# Patient Record
Sex: Female | Born: 1965 | Race: Black or African American | Hispanic: No | Marital: Married | Smoking: Former smoker
Health system: Southern US, Community
[De-identification: ages and names within clinical notes are randomized; demographics above are authoritative.]

## PROBLEM LIST (undated history)

## (undated) DIAGNOSIS — T7840XA Allergy, unspecified, initial encounter: Secondary | ICD-10-CM

## (undated) DIAGNOSIS — I10 Essential (primary) hypertension: Secondary | ICD-10-CM

## (undated) DIAGNOSIS — L409 Psoriasis, unspecified: Secondary | ICD-10-CM

## (undated) DIAGNOSIS — R7303 Prediabetes: Secondary | ICD-10-CM

## (undated) DIAGNOSIS — E042 Nontoxic multinodular goiter: Secondary | ICD-10-CM

## (undated) HISTORY — DX: Essential (primary) hypertension: I10

## (undated) HISTORY — DX: Nontoxic multinodular goiter: E04.2

## (undated) HISTORY — DX: Allergy, unspecified, initial encounter: T78.40XA

---
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
## 1999-10-09 ENCOUNTER — Encounter: Payer: Self-pay | Admitting: Emergency Medicine

## 1999-10-09 ENCOUNTER — Encounter: Admission: RE | Admit: 1999-10-09 | Discharge: 1999-10-09 | Payer: Self-pay | Admitting: Emergency Medicine

## 2000-11-08 ENCOUNTER — Inpatient Hospital Stay (HOSPITAL_COMMUNITY): Admission: AD | Admit: 2000-11-08 | Discharge: 2000-11-08 | Payer: Self-pay | Admitting: *Deleted

## 2000-11-30 ENCOUNTER — Inpatient Hospital Stay (HOSPITAL_COMMUNITY): Admission: AD | Admit: 2000-11-30 | Discharge: 2000-11-30 | Payer: Self-pay | Admitting: Obstetrics

## 2014-11-16 ENCOUNTER — Emergency Department (HOSPITAL_COMMUNITY)
Admission: EM | Admit: 2014-11-16 | Discharge: 2014-11-16 | Disposition: A | Discharge status: Home / Self Care | Payer: 59 | Attending: Emergency Medicine | Admitting: Emergency Medicine

## 2014-11-16 ENCOUNTER — Encounter (HOSPITAL_COMMUNITY): Payer: Self-pay | Admitting: Emergency Medicine

## 2014-11-16 DIAGNOSIS — Z720 Tobacco use: Secondary | ICD-10-CM | POA: Diagnosis not present

## 2014-11-16 DIAGNOSIS — R197 Diarrhea, unspecified: Secondary | ICD-10-CM | POA: Diagnosis not present

## 2014-11-16 DIAGNOSIS — R112 Nausea with vomiting, unspecified: Secondary | ICD-10-CM | POA: Diagnosis not present

## 2014-11-16 DIAGNOSIS — R1030 Lower abdominal pain, unspecified: Secondary | ICD-10-CM | POA: Diagnosis present

## 2014-11-16 DIAGNOSIS — Z3202 Encounter for pregnancy test, result negative: Secondary | ICD-10-CM | POA: Insufficient documentation

## 2014-11-16 LAB — COMPREHENSIVE METABOLIC PANEL
ALT: 16 U/L (ref 14–54)
AST: 19 U/L (ref 15–41)
Albumin: 4.1 g/dL (ref 3.5–5.0)
Alkaline Phosphatase: 73 U/L (ref 38–126)
Anion gap: 8 (ref 5–15)
BUN: 16 mg/dL (ref 6–20)
CO2: 27 mmol/L (ref 22–32)
Calcium: 9.6 mg/dL (ref 8.9–10.3)
Chloride: 103 mmol/L (ref 101–111)
Creatinine, Ser: 0.95 mg/dL (ref 0.44–1.00)
GFR calc Af Amer: >60 mL/min (ref >60)
GFR calc non Af Amer: >60 mL/min (ref >60)
Glucose, Bld: 167 mg/dL — ABNORMAL HIGH (ref 65–99)
Potassium: 3.8 mmol/L (ref 3.5–5.1)
Sodium: 138 mmol/L (ref 135–145)
Total Bilirubin: 0.5 mg/dL (ref 0.3–1.2)
Total Protein: 7.2 g/dL (ref 6.5–8.1)

## 2014-11-16 LAB — CBC
HCT: 39.7 % (ref 36.0–46.0)
Hemoglobin: 13.3 g/dL (ref 12.0–15.0)
MCH: 28.9 pg (ref 26.0–34.0)
MCHC: 33.5 g/dL (ref 30.0–36.0)
MCV: 86.1 fL (ref 78.0–100.0)
Platelets: 194 10*3/uL (ref 150–400)
RBC: 4.61 MIL/uL (ref 3.87–5.11)
RDW: 13.6 % (ref 11.5–15.5)
WBC: 11.3 10*3/uL — ABNORMAL HIGH (ref 4.0–10.5)

## 2014-11-16 LAB — URINALYSIS, ROUTINE W REFLEX MICROSCOPIC
BILIRUBIN URINE: NEGATIVE
Glucose, UA: 100 mg/dL — AB
Hgb urine dipstick: NEGATIVE
Ketones, ur: NEGATIVE mg/dL
LEUKOCYTES UA: NEGATIVE
NITRITE: NEGATIVE
PH: 7.5 (ref 5.0–8.0)
Protein, ur: NEGATIVE mg/dL
SPECIFIC GRAVITY, URINE: 1.014 (ref 1.005–1.030)
Urobilinogen, UA: 0.2 mg/dL (ref 0.0–1.0)

## 2014-11-16 LAB — PREGNANCY, URINE: Preg Test, Ur: NEGATIVE

## 2014-11-16 MED ORDER — ONDANSETRON 4 MG PO TBDP: 4.0000 mg | ORAL_TABLET | Freq: Three times a day (TID) | ORAL | Status: DC | PRN

## 2014-11-16 MED ORDER — ONDANSETRON HCL 4 MG/2ML IJ SOLN
4.0000 mg | Freq: Once | INTRAMUSCULAR | Status: AC
  Administered 2014-11-16: 4 mg via INTRAVENOUS
  Filled 2014-11-16: qty 2

## 2014-11-16 MED ORDER — OXYCODONE-ACETAMINOPHEN 5-325 MG PO TABS
1.0000 | ORAL_TABLET | Freq: Once | ORAL | Status: AC
  Administered 2014-11-16: 1 via ORAL
  Filled 2014-11-16: qty 1

## 2014-11-16 MED ORDER — SODIUM CHLORIDE 0.9 % IV BOLUS (SEPSIS)
1000.0000 mL | Freq: Once | INTRAVENOUS | Status: AC
Start: 2014-11-16 — End: 2014-11-16
  Administered 2014-11-16: 1000 mL via INTRAVENOUS

## 2014-11-16 MED ORDER — LOPERAMIDE HCL 2 MG PO CAPS: 2.0000 mg | ORAL_CAPSULE | Freq: Four times a day (QID) | ORAL | Status: DC | PRN

## 2014-11-16 NOTE — ED Provider Notes (Signed)
CSN: [id number]     Arrival date & time 11/16/14  0255 History   This chart was scribed for  Merryl Hacker, MD by Altamease Oiler, ED Scribe. This patient was seen in room A01C/A01C and the patient's care was started at 3:13 AM.  Chief Complaint  Patient presents with  . Abdominal Pain    The history is provided by the patient. No language interpreter was used.   Whitney Patton is a 49 y.o. female who presents to the Emergency Department complaining of constant lower abdominal pain with onset around 11 PM tonight. The pain is described as sharp, pressure, and cramping. At initial onset the pain was 10/10 in severity but she rates it less than 1/10 currently. No modifying factors. Pt has never had pain like this before tonight. Associated symptoms include nausea, vomiting, and diarrhea. No known sick contact. No fever, chills,vaginal bleeding, abnormal vaginal discharge, dysuria, hematemesis, or hematochezia. No previous abdominal surgeries. No daily medication.   History reviewed. No pertinent past medical history. History reviewed. No pertinent past surgical history. No family history on file. History  Substance Use Topics  . Smoking status: Current Every Day Smoker  . Smokeless tobacco: Not on file  . Alcohol Use: Yes   OB History    No data available     Review of Systems  Constitutional: Negative for fever.  Respiratory: Negative for cough, chest tightness and shortness of breath.   Cardiovascular: Negative for chest pain.  Gastrointestinal: Positive for nausea, vomiting, abdominal pain and diarrhea.  Genitourinary: Negative for dysuria.  Musculoskeletal: Negative for back pain.  Neurological: Negative for headaches.  All other systems reviewed and are negative.     Allergies  Review of patient's allergies indicates no known allergies.  Home Medications   Prior to Admission medications   Medication Sig Start Date End Date Taking? Authorizing Provider  loperamide  (IMODIUM) 2 MG capsule Take 1 capsule (2 mg total) by mouth 4 (four) times daily as needed for diarrhea or loose stools. 11/16/14   Merryl Hacker, MD  ondansetron (ZOFRAN ODT) 4 MG disintegrating tablet Take 1 tablet (4 mg total) by mouth every 8 (eight) hours as needed for nausea or vomiting. 11/16/14   Merryl Hacker, MD   BP 204/108 mmHg  Pulse 72  Temp(Src) 97.6 F (36.4 C) (Oral)  Resp 20  SpO2 98%  LMP 11/01/2014 (Exact Date) Physical Exam  Constitutional: She is oriented to person, place, and time. She appears well-developed and well-nourished. No distress.  HENT:  Head: Normocephalic and atraumatic.  Mouth/Throat: Oropharynx is clear and moist.  Cardiovascular: Normal rate, regular rhythm and normal heart sounds.   Pulmonary/Chest: Effort normal. No respiratory distress. She has no wheezes.  Abdominal: Soft. Bowel sounds are normal. There is no tenderness. There is no rebound and no guarding.  Neurological: She is alert and oriented to person, place, and time.  Skin: Skin is warm and dry.  Psychiatric: She has a normal mood and affect.  Nursing note and vitals reviewed.   ED Course  Procedures   DIAGNOSTIC STUDIES: Oxygen Saturation is 98% on RA, normal by my interpretation.    COORDINATION OF CARE: 3:18 AM Discussed treatment plan which includes lab work, Zofran, and IVF with pt at bedside and pt agreed to plan.  Labs Review Labs Reviewed  COMPREHENSIVE METABOLIC PANEL - Abnormal; Notable for the following:    Glucose, Bld 167 (*)    All other components within normal limits  CBC - Abnormal; Notable for the following:    WBC 11.3 (*)    All other components within normal limits  URINALYSIS, ROUTINE W REFLEX MICROSCOPIC (NOT AT [facility]) - Abnormal; Notable for the following:    APPearance CLOUDY (*)    Glucose, UA 100 (*)    All other components within normal limits  PREGNANCY, URINE    Imaging Review No results found.   EKG Interpretation None       MDM   Final diagnoses:  Nausea vomiting and diarrhea    Patient presents with abdominal pain, nausea, vomiting, diarrhea. Nontoxic on exam. Initial hypertensive but improving blood pressures during ER stay. Currently pain free. Basic labwork obtained and reassuring. Patient has had multiple episodes of diarrhea in the emergency room and waxing and waning crampy pain. Given reassuring workup and exam, suspect viral etiology. Discussed with patient symptom control including Zofran and Imodium. Do not feel the patient needs imaging at this time. She is able tolerate fluids prior to discharge. Discussed with patient strict return precautions.  After history, exam, and medical workup I feel the patient has been appropriately medically screened and is safe for discharge home. Pertinent diagnoses were discussed with the patient. Patient was given return precautions.   I personally performed the services described in this documentation, which was scribed in my presence. The recorded information has been reviewed and is accurate.      Merryl Hacker, MD 11/17/14 [phone]

## 2014-11-16 NOTE — ED Notes (Signed)
Pt. reports low abdominal pain with emesis and diarrhea onset this evening , denies fever or chills. Hypertensive at triage .

## 2014-11-16 NOTE — ED Notes (Signed)
Pt ambulated to the bathroom independently.

## 2014-11-16 NOTE — ED Notes (Signed)
Pt verbalized undertanding of d/c instructions and has no further questions.

## 2014-11-16 NOTE — ED Notes (Signed)
Pt given [place] sandwich and ginger ale to drink.

## 2014-11-16 NOTE — Discharge Instructions (Signed)

## 2015-12-24 ENCOUNTER — Encounter: Payer: Self-pay | Admitting: Internal Medicine

## 2015-12-24 ENCOUNTER — Ambulatory Visit (INDEPENDENT_AMBULATORY_CARE_PROVIDER_SITE_OTHER): Payer: 59 | Admitting: Internal Medicine

## 2015-12-24 VITALS — BP 148/92 | HR 67 | Ht 62.0 in | Wt 131.0 lb

## 2015-12-24 DIAGNOSIS — Z0000 Encounter for general adult medical examination without abnormal findings: Secondary | ICD-10-CM | POA: Insufficient documentation

## 2015-12-24 DIAGNOSIS — J309 Allergic rhinitis, unspecified: Secondary | ICD-10-CM | POA: Diagnosis not present

## 2015-12-24 DIAGNOSIS — Z23 Encounter for immunization: Secondary | ICD-10-CM | POA: Diagnosis not present

## 2015-12-24 DIAGNOSIS — B372 Candidiasis of skin and nail: Secondary | ICD-10-CM | POA: Diagnosis not present

## 2015-12-24 DIAGNOSIS — I10 Essential (primary) hypertension: Secondary | ICD-10-CM | POA: Diagnosis not present

## 2015-12-24 DIAGNOSIS — Z1211 Encounter for screening for malignant neoplasm of colon: Secondary | ICD-10-CM

## 2015-12-24 MED ORDER — FLUTICASONE PROPIONATE 50 MCG/ACT NA SUSP: 2.0000 | Freq: Every day | NASAL | 6 refills | Status: DC

## 2015-12-24 MED ORDER — KETOCONAZOLE 2 % EX CREA: 1.0000 "application " | TOPICAL_CREAM | Freq: Every day | CUTANEOUS | 1 refills | Status: DC

## 2015-12-24 MED ORDER — AMLODIPINE BESYLATE 5 MG PO TABS: 5.0000 mg | ORAL_TABLET | Freq: Every day | ORAL | 11 refills | Status: DC

## 2015-12-24 MED ORDER — VITAMIN D 1000 UNITS PO TABS: 1000.0000 [IU] | ORAL_TABLET | Freq: Every day | ORAL | 3 refills | Status: DC

## 2015-12-24 MED ORDER — LORATADINE 10 MG PO TABS: 10.0000 mg | ORAL_TABLET | Freq: Every day | ORAL | 3 refills | Status: DC

## 2015-12-24 NOTE — Assessment & Plan Note (Signed)
Loratidine. Flonase

## 2015-12-24 NOTE — Assessment & Plan Note (Signed)
Restart Norvasc

## 2015-12-24 NOTE — Assessment & Plan Note (Signed)
L hand between fingers Ketoconazole cream

## 2015-12-24 NOTE — Assessment & Plan Note (Addendum)
We discussed age appropriate health related issues, including available/recomended screening tests and vaccinations. We discussed a need for adhering to healthy diet and exercise. Labs/EKG were reviewed/ordered. All questions were answered. PAP/GYN examadvised Colon to sch Stop smoking Pt declined mammo - "too painful"...

## 2015-12-24 NOTE — Progress Notes (Signed)
Subjective:  Patient ID: Whitney Patton, female    DOB: [birth year]  Age: 50 y.o. MRN: [id number]  CC: Establish Care   HPI Whitney Patton presents for a new pt well visit.  C/o HTN, sinus issues Rash between fingers - not better w/Kenalog  Outpatient Medications Prior to Visit  Medication Sig Dispense Refill  . loperamide (IMODIUM) 2 MG capsule Take 1 capsule (2 mg total) by mouth 4 (four) times daily as needed for diarrhea or loose stools. (Patient not taking: Reported on 12/24/2015) 12 capsule 0  . ondansetron (ZOFRAN ODT) 4 MG disintegrating tablet Take 1 tablet (4 mg total) by mouth every 8 (eight) hours as needed for nausea or vomiting. (Patient not taking: Reported on 12/24/2015) 20 tablet 0   No facility-administered medications prior to visit.     ROS Review of Systems  Constitutional: Negative for activity change, appetite change, chills, fatigue and unexpected weight change.  HENT: Positive for rhinorrhea. Negative for congestion, mouth sores and sinus pressure.   Eyes: Negative for visual disturbance.  Respiratory: Negative for cough and chest tightness.   Gastrointestinal: Negative for abdominal pain and nausea.  Genitourinary: Negative for difficulty urinating, frequency and vaginal pain.  Musculoskeletal: Negative for back pain and gait problem.  Skin: Negative for pallor and rash.  Neurological: Negative for dizziness, tremors, weakness, numbness and headaches.  Psychiatric/Behavioral: Negative for confusion, sleep disturbance and suicidal ideas.    Objective:  BP (!) 148/92   Pulse 67   Ht 5\' 2"  (1.575 m)   Wt 131 lb (59.4 kg)   SpO2 97%   BMI 23.96 kg/m   BP Readings from Last 3 Encounters:  12/24/15 (!) 148/92  11/16/14 140/77    Wt Readings from Last 3 Encounters:  12/24/15 131 lb (59.4 kg)    Physical Exam  Constitutional: She appears well-developed. No distress.  HENT:  Head: Normocephalic.  Right Ear: External ear normal.  Left Ear: External  ear normal.  Nose: Nose normal.  Mouth/Throat: Oropharynx is clear and moist.  Eyes: Conjunctivae are normal. Pupils are equal, round, and reactive to light. Right eye exhibits no discharge. Left eye exhibits no discharge.  Neck: Normal range of motion. Neck supple. No JVD present. No tracheal deviation present. No thyromegaly present.  Cardiovascular: Normal rate, regular rhythm and normal heart sounds.   Pulmonary/Chest: No stridor. No respiratory distress. She has no wheezes.  Abdominal: Soft. Bowel sounds are normal. She exhibits no distension and no mass. There is no tenderness. There is no rebound and no guarding.  Musculoskeletal: She exhibits no edema or tenderness.  Lymphadenopathy:    She has no cervical adenopathy.  Neurological: She displays normal reflexes. No cranial nerve deficit. She exhibits normal muscle tone. Coordination normal.  Skin: No rash noted. No erythema.  Psychiatric: She has a normal mood and affect. Her behavior is normal. Judgment and thought content normal.    Lab Results  Component Value Date   WBC 11.3 (H) 11/16/2014   HGB 13.3 11/16/2014   HCT 39.7 11/16/2014   PLT 194 11/16/2014   GLUCOSE 167 (H) 11/16/2014   ALT 16 11/16/2014   AST 19 11/16/2014   NA 138 11/16/2014   K 3.8 11/16/2014   CL 103 11/16/2014   CREATININE 0.95 11/16/2014   BUN 16 11/16/2014   CO2 27 11/16/2014    No results found.  Assessment & Plan:   There are no diagnoses linked to this encounter. I am having Ms. Eighmey maintain  her ondansetron, loperamide, amLODipine, and triamcinolone cream.  Meds ordered this encounter  Medications  . amLODipine (NORVASC) 5 MG tablet    Sig: Take 1 tablet by mouth daily.  Marland Kitchen triamcinolone cream (KENALOG) 0.1 %    Sig: daily.     Follow-up: No Follow-up on file.  Walker Kehr, MD

## 2015-12-24 NOTE — Progress Notes (Signed)
Pre visit review using our clinic review tool, if applicable. No additional management support is needed unless otherwise documented below in the visit note. 

## 2015-12-26 ENCOUNTER — Other Ambulatory Visit (INDEPENDENT_AMBULATORY_CARE_PROVIDER_SITE_OTHER): Payer: 59

## 2015-12-26 DIAGNOSIS — Z0000 Encounter for general adult medical examination without abnormal findings: Secondary | ICD-10-CM | POA: Diagnosis not present

## 2015-12-26 LAB — CBC WITH DIFFERENTIAL/PLATELET
Basophils Absolute: 0 10*3/uL (ref 0.0–0.1)
Basophils Relative: 0.4 % (ref 0.0–3.0)
Eosinophils Absolute: 0.1 10*3/uL (ref 0.0–0.7)
Eosinophils Relative: 3 % (ref 0.0–5.0)
HCT: 38 % (ref 36.0–46.0)
HEMOGLOBIN: 13.1 g/dL (ref 12.0–15.0)
Lymphocytes Relative: 43 % (ref 12.0–46.0)
Lymphs Abs: 1.9 10*3/uL (ref 0.7–4.0)
MCHC: 34.5 g/dL (ref 30.0–36.0)
MCV: 83.1 fl (ref 78.0–100.0)
Monocytes Absolute: 0.6 10*3/uL (ref 0.1–1.0)
Monocytes Relative: 12.3 % — ABNORMAL HIGH (ref 3.0–12.0)
Neutro Abs: 1.9 10*3/uL (ref 1.4–7.7)
Neutrophils Relative %: 41.3 % — ABNORMAL LOW (ref 43.0–77.0)
Platelets: 171 10*3/uL (ref 150.0–400.0)
RBC: 4.57 Mil/uL (ref 3.87–5.11)
RDW: 12.9 % (ref 11.5–15.5)
WBC: 4.5 10*3/uL (ref 4.0–10.5)

## 2015-12-26 LAB — URINALYSIS
BILIRUBIN URINE: NEGATIVE
HGB URINE DIPSTICK: NEGATIVE
Ketones, ur: NEGATIVE
Leukocytes, UA: NEGATIVE
Nitrite: NEGATIVE
Specific Gravity, Urine: 1.015 (ref 1.000–1.030)
Total Protein, Urine: NEGATIVE
URINE GLUCOSE: NEGATIVE
UROBILINOGEN UA: 0.2 (ref 0.0–1.0)
pH: 7 (ref 5.0–8.0)

## 2015-12-26 LAB — BASIC METABOLIC PANEL
BUN: 14 mg/dL (ref 6–23)
CHLORIDE: 105 meq/L (ref 96–112)
CO2: 26 meq/L (ref 19–32)
CREATININE: 0.73 mg/dL (ref 0.40–1.20)
Calcium: 8.7 mg/dL (ref 8.4–10.5)
GFR: 107.84 mL/min (ref >60.00)
Glucose, Bld: 103 mg/dL — ABNORMAL HIGH (ref 70–99)
Potassium: 4.1 mEq/L (ref 3.5–5.1)
Sodium: 135 mEq/L (ref 135–145)

## 2015-12-26 LAB — TSH: TSH: 0.86 u[IU]/mL (ref 0.35–4.50)

## 2015-12-26 LAB — LIPID PANEL
CHOL/HDL RATIO: 2
Cholesterol: 163 mg/dL (ref 0–200)
HDL: 73.4 mg/dL (ref >39.00)
LDL Cholesterol: 81 mg/dL (ref 0–99)
NONHDL: 89.31
TRIGLYCERIDES: 40 mg/dL (ref 0.0–149.0)
VLDL: 8 mg/dL (ref 0.0–40.0)

## 2015-12-26 LAB — HEPATIC FUNCTION PANEL
ALK PHOS: 57 U/L (ref 39–117)
ALT: 11 U/L (ref 0–35)
AST: 15 U/L (ref 0–37)
Albumin: 4 g/dL (ref 3.5–5.2)
Bilirubin, Direct: 0.1 mg/dL (ref 0.0–0.3)
TOTAL PROTEIN: 6.8 g/dL (ref 6.0–8.3)
Total Bilirubin: 0.4 mg/dL (ref 0.2–1.2)

## 2015-12-29 ENCOUNTER — Other Ambulatory Visit: Payer: Self-pay | Admitting: Internal Medicine

## 2015-12-29 ENCOUNTER — Telehealth: Payer: Self-pay | Admitting: Obstetrics and Gynecology

## 2015-12-29 LAB — VITAMIN D 25 HYDROXY (VIT D DEFICIENCY, FRACTURES): VITD: 17.93 ng/mL — ABNORMAL LOW (ref 30.00–100.00)

## 2015-12-29 MED ORDER — ERGOCALCIFEROL 1.25 MG (50000 UT) PO CAPS: 50000.0000 [IU] | ORAL_CAPSULE | ORAL | 0 refills | Status: DC

## 2015-12-29 NOTE — Telephone Encounter (Signed)
Called and left a message for patient to call back to schedule a new patient doctor referral. °

## 2015-12-30 ENCOUNTER — Encounter: Payer: Self-pay | Admitting: Internal Medicine

## 2015-12-31 ENCOUNTER — Ambulatory Visit: Payer: Self-pay | Admitting: Gynecology

## 2015-12-31 ENCOUNTER — Telehealth: Payer: Self-pay | Admitting: Internal Medicine

## 2015-12-31 NOTE — Telephone Encounter (Signed)
Is it Ketoconazole or Triamcinolone? Thx

## 2015-12-31 NOTE — Telephone Encounter (Signed)
Patient states Dr. Camila Li prescribed her a cream for a rash she had - possible eczema.  Patient states cream is causing her to itch and making rash scab over.  Please follow up with patient in regard.

## 2016-01-01 NOTE — Telephone Encounter (Signed)
Use triamcinolone instead Thx

## 2016-01-01 NOTE — Telephone Encounter (Signed)
Its the Ketoconazole cream.../lmb

## 2016-01-02 MED ORDER — TRIAMCINOLONE ACETONIDE 0.1 % EX CREA: TOPICAL_CREAM | CUTANEOUS | 0 refills | Status: DC

## 2016-01-02 NOTE — Telephone Encounter (Signed)
Called pt no answer LMOM w/MD recommendation...Johny Chess

## 2016-01-14 ENCOUNTER — Encounter: Payer: Self-pay | Admitting: Obstetrics and Gynecology

## 2016-01-14 ENCOUNTER — Ambulatory Visit (INDEPENDENT_AMBULATORY_CARE_PROVIDER_SITE_OTHER): Payer: 59 | Admitting: Obstetrics and Gynecology

## 2016-01-14 ENCOUNTER — Other Ambulatory Visit: Payer: Self-pay | Admitting: Obstetrics and Gynecology

## 2016-01-14 VITALS — BP 150/90 | HR 68 | Resp 14 | Ht 63.0 in | Wt 130.0 lb

## 2016-01-14 DIAGNOSIS — Z01419 Encounter for gynecological examination (general) (routine) without abnormal findings: Secondary | ICD-10-CM | POA: Diagnosis not present

## 2016-01-14 DIAGNOSIS — Z124 Encounter for screening for malignant neoplasm of cervix: Secondary | ICD-10-CM | POA: Diagnosis not present

## 2016-01-14 DIAGNOSIS — Z1231 Encounter for screening mammogram for malignant neoplasm of breast: Secondary | ICD-10-CM

## 2016-01-14 NOTE — Patient Instructions (Addendum)
Use Benadryl for your rash  EXERCISE AND DIET:  We recommended that you start or continue a regular exercise program for good health. Regular exercise means any activity that makes your heart beat faster and makes you sweat.  We recommend exercising at least 30 minutes per day at least 3 days a week, preferably 4 or 5.  We also recommend a diet low in fat and sugar.  Inactivity, poor dietary choices and obesity can cause diabetes, heart attack, stroke, and kidney damage, among others.    ALCOHOL AND SMOKING:  Women should limit their alcohol intake to no more than 7 drinks/beers/glasses of wine (combined, not each!) per week. Moderation of alcohol intake to this level decreases your risk of breast cancer and liver damage. And of course, no recreational drugs are part of a healthy lifestyle.  And absolutely no smoking or even second hand smoke. Most people know smoking can cause heart and lung diseases, but did you know it also contributes to weakening of your bones? Aging of your skin?  Yellowing of your teeth and nails?  CALCIUM AND VITAMIN D:  Adequate intake of calcium and Vitamin D are recommended.  The recommendations for exact amounts of these supplements seem to change often, but generally speaking 600 mg of calcium (either carbonate or citrate) and 800 units of Vitamin D per day seems prudent. Certain women may benefit from higher intake of Vitamin D.  If you are among these women, your doctor will have told you during your visit.    PAP SMEARS:  Pap smears, to check for cervical cancer or precancers,  have traditionally been done yearly, although recent scientific advances have shown that most women can have pap smears less often.  However, every woman still should have a physical exam from her gynecologist every year. It will include a breast check, inspection of the vulva and vagina to check for abnormal growths or skin changes, a visual exam of the cervix, and then an exam to evaluate the size  and shape of the uterus and ovaries.  And after 51 years of age, a rectal exam is indicated to check for rectal cancers. We will also provide age appropriate advice regarding health maintenance, like when you should have certain vaccines, screening for sexually transmitted diseases, bone density testing, colonoscopy, mammograms, etc.   MAMMOGRAMS:  All women over 4 years old should have a yearly mammogram. Many facilities now offer a "3D" mammogram, which may cost around $50 extra out of pocket. If possible,  we recommend you accept the option to have the 3D mammogram performed.  It both reduces the number of women who will be called back for extra views which then turn out to be normal, and it is better than the routine mammogram at detecting truly abnormal areas.    COLONOSCOPY:  Colonoscopy to screen for colon cancer is recommended for all women at age 54.  We know, you hate the idea of the prep.  We agree, BUT, having colon cancer and not knowing it is worse!!  Colon cancer so often starts as a polyp that can be seen and removed at colonscopy, which can quite literally save your life!  And if your first colonoscopy is normal and you have no family history of colon cancer, most women don't have to have it again for 10 years.  Once every ten years, you can do something that may end up saving your life, right?  We will be happy to help you get  it scheduled when you are ready.  Be sure to check your insurance coverage so you understand how much it will cost.  It may be covered as a preventative service at no cost, but you should check your particular policy.      Breast Self-Awareness Practicing breast self-awareness may pick up problems early, prevent significant medical complications, and possibly save your life. By practicing breast self-awareness, you can become familiar with how your breasts look and feel and if your breasts are changing. This allows you to notice changes early. It can also offer you  some reassurance that your breast health is good. One way to learn what is normal for your breasts and whether your breasts are changing is to do a breast self-exam. If you find a lump or something that was not present in the past, it is best to contact your caregiver right away. Other findings that should be evaluated by your caregiver include nipple discharge, especially if it is bloody; skin changes or reddening; areas where the skin seems to be pulled in (retracted); or new lumps and bumps. Breast pain is seldom associated with cancer (malignancy), but should also be evaluated by a caregiver. HOW TO PERFORM A BREAST SELF-EXAM The best time to examine your breasts is 5-7 days after your menstrual period is over. During menstruation, the breasts are lumpier, and it may be more difficult to pick up changes. If you do not menstruate, have reached menopause, or had your uterus removed (hysterectomy), you should examine your breasts at regular intervals, such as monthly. If you are breastfeeding, examine your breasts after a feeding or after using a breast pump. Breast implants do not decrease the risk for lumps or tumors, so continue to perform breast self-exams as recommended. Talk to your caregiver about how to determine the difference between the implant and breast tissue. Also, talk about the amount of pressure you should use during the exam. Over time, you will become more familiar with the variations of your breasts and more comfortable with the exam. A breast self-exam requires you to remove all your clothes above the waist. 1. Look at your breasts and nipples. Stand in front of a mirror in a room with good lighting. With your hands on your hips, push your hands firmly downward. Look for a difference in shape, contour, and size from one breast to the other (asymmetry). Asymmetry includes puckers, dips, or bumps. Also, look for skin changes, such as reddened or scaly areas on the breasts. Look for nipple  changes, such as discharge, dimpling, repositioning, or redness. 2. Carefully feel your breasts. This is best done either in the shower or tub while using soapy water or when flat on your back. Place the arm (on the side of the breast you are examining) above your head. Use the pads (not the fingertips) of your three middle fingers on your opposite hand to feel your breasts. Start in the underarm area and use  inch (2 cm) overlapping circles to feel your breast. Use 3 different levels of pressure (light, medium, and firm pressure) at each circle before moving to the next circle. The light pressure is needed to feel the tissue closest to the skin. The medium pressure will help to feel breast tissue a little deeper, while the firm pressure is needed to feel the tissue close to the ribs. Continue the overlapping circles, moving downward over the breast until you feel your ribs below your breast. Then, move one finger-width towards the center  of the body. Continue to use the  inch (2 cm) overlapping circles to feel your breast as you move slowly up toward the collar bone (clavicle) near the base of the neck. Continue the up and down exam using all 3 pressures until you reach the middle of the chest. Do this with each breast, carefully feeling for lumps or changes. 3.  Keep a written record with breast changes or normal findings for each breast. By writing this information down, you do not need to depend only on memory for size, tenderness, or location. Write down where you are in your menstrual cycle, if you are still menstruating. Breast tissue can have some lumps or thick tissue. However, see your caregiver if you find anything that concerns you.  SEEK MEDICAL CARE IF:  You see a change in shape, contour, or size of your breasts or nipples.   You see skin changes, such as reddened or scaly areas on the breasts or nipples.   You have an unusual discharge from your nipples.   You feel a new lump or  unusually thick areas.    This information is not intended to replace advice given to you by your health care provider. Make sure you discuss any questions you have with your health care provider.   Document Released: 04/05/2005 Document Revised: 03/22/2012 Document Reviewed: 07/21/2011 Elsevier Interactive Patient Education [employer].

## 2016-01-14 NOTE — Progress Notes (Signed)
50 y.o. G2P1011 MarriedAfrican AmericanF here for annual exam.   Period Cycle (Days): 28 Period Duration (Days): 5- 7 days  Period Pattern: Regular Menstrual Flow: Moderate Menstrual Control: Maxi pad Dysmenorrhea: None  Saturates a pad in 4 hours. No BTB. Sexually active, no contraception long term, no pain.  Slight vasomotor symptoms. Mild vaginal dryness.   Patient's last menstrual period was 01/05/2016.          Sexually active: Yes.    The current method of family planning is none.    Exercising: No.  The patient does not participate in regular exercise at present. Smoker:  yes  Health Maintenance: Pap:  unsure History of abnormal Pap:  Yes- years ago MMG:  unsure Colonoscopy:  Never, scheduled  BMD:   Never TDaP:  12-24-15 Gardasil: N/A   reports that she has been smoking Cigarettes.  She has been smoking about 0.25 packs per day. She has never used smokeless tobacco. She reports that she drinks about 4.2 oz of alcohol per week . She reports that she does not use drugs.Daughter is 80, 5 grandsons. Daughter has troubles doesn't have her kids. Married x 7 years, together for 10 years prior.   Past Medical History:  Diagnosis Date  . Allergy   . Hypertension     No past surgical history on file.  Current Outpatient Prescriptions  Medication Sig Dispense Refill  . amLODipine (NORVASC) 5 MG tablet Take 1 tablet (5 mg total) by mouth daily. 30 tablet 11  . cholecalciferol (VITAMIN D) 1000 units tablet Take 1 tablet (1,000 Units total) by mouth daily. 100 tablet 3  . ergocalciferol (VITAMIN D2) 50000 units capsule Take 1 capsule (50,000 Units total) by mouth once a week. 6 capsule 0  . triamcinolone cream (KENALOG) 0.1 % Apply to affected area twice a day as needed 30 g 0   No current facility-administered medications for this visit.     Family History  Problem Relation Age of Onset  . Lung cancer Mother   . Diabetes Father   . Diabetes Brother     Review of Systems   Constitutional: Negative.   HENT: Negative.   Eyes: Negative.   Respiratory: Negative.   Cardiovascular: Negative.   Gastrointestinal: Negative.   Endocrine: Negative.   Genitourinary: Negative.   Musculoskeletal: Negative.   Skin: Negative.   Allergic/Immunologic: Negative.   Neurological: Negative.   Psychiatric/Behavioral: Negative.     Exam:   BP (!) 150/90 (BP Location: Right Arm, Patient Position: Sitting, Cuff Size: Normal)   Pulse 68   Resp 14   Ht 5\' 3"  (1.6 m)   Wt 130 lb (59 kg)   LMP 01/05/2016   BMI 23.03 kg/m   Weight change: @WEIGHTCHANGE @ Height:   Height: 5\' 3"  (160 cm)  Ht Readings from Last 3 Encounters:  01/14/16 5\' 3"  (1.6 m)  12/24/15 5\' 2"  (1.575 m)    General appearance: alert, cooperative and appears stated age Head: Normocephalic, without obvious abnormality, atraumatic Neck: no adenopathy, supple, symmetrical, trachea midline and thyroid normal to inspection and palpation Lungs: clear to auscultation bilaterally Breasts: normal appearance, no masses or tenderness Heart: regular rate and rhythm Abdomen: soft, non-tender; bowel sounds normal; no masses,  no organomegaly Extremities: extremities normal, atraumatic, no cyanosis or edema Skin: Skin color, texture, turgor normal. No rashes or lesions Lymph nodes: Cervical, supraclavicular, and axillary nodes normal. No abnormal inguinal nodes palpated Neurologic: Grossly normal   Pelvic: External genitalia:  no lesions  Urethra:  normal appearing urethra with no masses, tenderness or lesions              Bartholins and Skenes: normal                 Vagina: normal appearing vagina with normal color and discharge, no lesions              Cervix: no lesions               Bimanual Exam:  Uterus:  normal size, contour, position, consistency, mobility, non-tender and retroverted              Adnexa: no mass, fullness, tenderness               Rectovaginal: Confirms               Anus:   normal sphincter tone, no lesions  Chaperone was present for exam.  A:  Well Woman with normal exam  P:   Pap with hpv  Mammogram  Colonoscopy UTD  Labs and immunizations with primary  Discussed breast self exam  Discussed calcium and vit D intake

## 2016-01-16 ENCOUNTER — Ambulatory Visit (INDEPENDENT_AMBULATORY_CARE_PROVIDER_SITE_OTHER): Payer: 59 | Admitting: Adult Health

## 2016-01-16 ENCOUNTER — Encounter: Payer: Self-pay | Admitting: Adult Health

## 2016-01-16 VITALS — BP 130/64 | Ht 63.0 in | Wt 129.1 lb

## 2016-01-16 DIAGNOSIS — L500 Allergic urticaria: Secondary | ICD-10-CM | POA: Diagnosis not present

## 2016-01-16 LAB — IPS PAP TEST WITH HPV

## 2016-01-16 MED ORDER — METHYLPREDNISOLONE ACETATE 80 MG/ML IJ SUSP
80.0000 mg | Freq: Once | INTRAMUSCULAR | Status: AC
  Administered 2016-01-16: 80 mg via INTRAMUSCULAR

## 2016-01-16 NOTE — Patient Instructions (Addendum)
It was great meeting you today.   You can continue with Benadryl tonight and if needed tomorrow evening.   You should notice a difference in the next 24 -36 hours.   Follow up with PCP if no improvement   Hives Hives are itchy, red, swollen areas of the skin. They can vary in size and location on your body. Hives can come and go for hours or several days (acute hives) or for several weeks (chronic hives). Hives do not spread from person to person (noncontagious). They may get worse with scratching, exercise, and emotional stress. CAUSES   Allergic reaction to food, additives, or drugs.  Infections, including the common cold.  Illness, such as vasculitis, lupus, or thyroid disease.  Exposure to sunlight, heat, or cold.  Exercise.  Stress.  Contact with chemicals. SYMPTOMS   Red or white swollen patches on the skin. The patches may change size, shape, and location quickly and repeatedly.  Itching.  Swelling of the hands, feet, and face. This may occur if hives develop deeper in the skin. DIAGNOSIS  Your caregiver can usually tell what is wrong by performing a physical exam. Skin or blood tests may also be done to determine the cause of your hives. In some cases, the cause cannot be determined. TREATMENT  Mild cases usually get better with medicines such as antihistamines. Severe cases may require an emergency epinephrine injection. If the cause of your hives is known, treatment includes avoiding that trigger.  HOME CARE INSTRUCTIONS   Avoid causes that trigger your hives.  Take antihistamines as directed by your caregiver to reduce the severity of your hives. Non-sedating or low-sedating antihistamines are usually recommended. Do not drive while taking an antihistamine.  Take any other medicines prescribed for itching as directed by your caregiver.  Wear loose-fitting clothing.  Keep all follow-up appointments as directed by your caregiver. SEEK MEDICAL CARE IF:   You  have persistent or severe itching that is not relieved with medicine.  You have painful or swollen joints. SEEK IMMEDIATE MEDICAL CARE IF:   You have a fever.  Your tongue or lips are swollen.  You have trouble breathing or swallowing.  You feel tightness in the throat or chest.  You have abdominal pain. These problems may be the first sign of a life-threatening allergic reaction. Call your local emergency services (911 in [place] MAKE SURE YOU:   Understand these instructions.  Will watch your condition.  Will get help right away if you are not doing well or get worse.   This information is not intended to replace advice given to you by your health care provider. Make sure you discuss any questions you have with your health care provider.   Document Released: 04/05/2005 Document Revised: 04/10/2013 Document Reviewed: 06/29/2011 Elsevier Interactive Patient Education [employer].

## 2016-01-16 NOTE — Progress Notes (Signed)
   Subjective:    Patient ID: Whitney Patton, female    DOB: [birth year], 50 y.o.   MRN: [id number]  HPI  50 year old female who presents to the office today for the complaint of a rash on her left forearm that started about a week ago. She reports that the rash is itchy and appears as " spots" of different sizes. She feels as though the "spots" are increasing in number. She first reports that the rash started to show up a few hours after eating seafood.   She has not changed shampoos, detergents or lotions.   She has been using Benadryl and anti itch cream that help minimally.   Denies any SOB or chest pain  Review of Systems  Constitutional: Negative.   Respiratory: Negative.   Cardiovascular: Negative.   Skin: Positive for rash.  Neurological: Negative.   All other systems reviewed and are negative.      Objective:   Physical Exam  Constitutional: She is oriented to person, place, and time. She appears well-developed and well-nourished. No distress.  Cardiovascular: Normal rate, regular rhythm, normal heart sounds and intact distal pulses.  Exam reveals no gallop and no friction rub.   No murmur heard. Neurological: She is alert and oriented to person, place, and time.  Skin: Skin is warm and dry. Rash (appears as Urticaria on left forearm, most noticably in the bend of the arm) noted. She is not diaphoretic. No erythema. No pallor.  Psychiatric: She has a normal mood and affect. Her behavior is normal. Judgment and thought content normal.  Nursing note and vitals reviewed.     Assessment & Plan:  1. Allergic urticaria - Unknown what has caused her allergic reaction. Possibly seafood.  - methylPREDNISolone acetate (DEPO-MEDROL) injection 80 mg; Inject 1 mL (80 mg total) into the muscle once. - Follow up if no improvement  Dorothyann Peng, NP

## 2016-01-20 ENCOUNTER — Ambulatory Visit: Payer: 59 | Admitting: Adult Health

## 2016-01-20 ENCOUNTER — Ambulatory Visit: Payer: 59 | Admitting: Internal Medicine

## 2016-01-21 ENCOUNTER — Encounter: Payer: Self-pay | Admitting: Internal Medicine

## 2016-01-21 ENCOUNTER — Ambulatory Visit (INDEPENDENT_AMBULATORY_CARE_PROVIDER_SITE_OTHER): Payer: 59 | Admitting: Internal Medicine

## 2016-01-21 DIAGNOSIS — J069 Acute upper respiratory infection, unspecified: Secondary | ICD-10-CM | POA: Diagnosis not present

## 2016-01-21 DIAGNOSIS — L500 Allergic urticaria: Secondary | ICD-10-CM | POA: Diagnosis not present

## 2016-01-21 DIAGNOSIS — I10 Essential (primary) hypertension: Secondary | ICD-10-CM | POA: Diagnosis not present

## 2016-01-21 MED ORDER — IRBESARTAN 300 MG PO TABS: 300.0000 mg | ORAL_TABLET | Freq: Every day | ORAL | 11 refills | Status: DC

## 2016-01-21 NOTE — Progress Notes (Signed)
Subjective:  Patient ID: Whitney Patton, female    DOB: 13-Oct-1965  Age: 50 y.o. MRN: [id number]  CC: Rash (bilateral arms x 3 weeks. )   HPI Whitney Patton presents for hives on B arms x 4-6 weeks - recurrent  Outpatient Medications Prior to Visit  Medication Sig Dispense Refill  . amLODipine (NORVASC) 5 MG tablet Take 1 tablet (5 mg total) by mouth daily. 30 tablet 11  . cholecalciferol (VITAMIN D) 1000 units tablet Take 1 tablet (1,000 Units total) by mouth daily. 100 tablet 3  . ergocalciferol (VITAMIN D2) 50000 units capsule Take 1 capsule (50,000 Units total) by mouth once a week. 6 capsule 0  . triamcinolone cream (KENALOG) 0.1 % Apply to affected area twice a day as needed 30 g 0   No facility-administered medications prior to visit.     ROS Review of Systems  Constitutional: Negative for activity change, appetite change, chills, fatigue and unexpected weight change.  HENT: Negative for congestion, mouth sores and sinus pressure.   Eyes: Negative for visual disturbance.  Respiratory: Negative for cough and chest tightness.   Gastrointestinal: Negative for abdominal pain and nausea.  Genitourinary: Negative for difficulty urinating, frequency and vaginal pain.  Musculoskeletal: Negative for back pain and gait problem.  Skin: Positive for rash. Negative for pallor.  Neurological: Negative for dizziness, tremors, weakness, numbness and headaches.  Psychiatric/Behavioral: Negative for confusion and sleep disturbance.    Objective:  BP (!) 154/100   Pulse 86   Temp 98.2 F (36.8 C) (Oral)   Wt 133 lb (60.3 kg)   LMP 01/05/2016   SpO2 97%   BMI 23.56 kg/m   BP Readings from Last 3 Encounters:  01/21/16 (!) 154/100  01/16/16 130/64  01/14/16 (!) 150/90    Wt Readings from Last 3 Encounters:  01/21/16 133 lb (60.3 kg)  01/16/16 129 lb 1.6 oz (58.6 kg)  01/14/16 130 lb (59 kg)    Physical Exam  Constitutional: She appears well-developed. No distress.  HENT:    Head: Normocephalic.  Right Ear: External ear normal.  Left Ear: External ear normal.  Nose: Nose normal.  Mouth/Throat: Oropharynx is clear and moist.  Eyes: Conjunctivae are normal. Pupils are equal, round, and reactive to light. Right eye exhibits no discharge. Left eye exhibits no discharge.  Neck: Normal range of motion. Neck supple. No JVD present. No tracheal deviation present. No thyromegaly present.  Cardiovascular: Normal rate, regular rhythm and normal heart sounds.   Pulmonary/Chest: No stridor. No respiratory distress. She has no wheezes.  Abdominal: Soft. Bowel sounds are normal. She exhibits no distension and no mass. There is no tenderness. There is no rebound and no guarding.  Musculoskeletal: She exhibits no edema or tenderness.  Lymphadenopathy:    She has no cervical adenopathy.  Neurological: She displays normal reflexes. No cranial nerve deficit. She exhibits normal muscle tone. Coordination normal.  Skin: Rash noted. There is erythema.  Psychiatric: She has a normal mood and affect. Her behavior is normal. Judgment and thought content normal.   Eryth hives on B forears x6-9 each  Lab Results  Component Value Date   WBC 4.5 12/26/2015   HGB 13.1 12/26/2015   HCT 38.0 12/26/2015   PLT 171.0 12/26/2015   GLUCOSE 103 (H) 12/26/2015   CHOL 163 12/26/2015   TRIG 40.0 12/26/2015   HDL 73.40 12/26/2015   LDLCALC 81 12/26/2015   ALT 11 12/26/2015   AST 15 12/26/2015   NA 135  12/26/2015   K 4.1 12/26/2015   CL 105 12/26/2015   CREATININE 0.73 12/26/2015   BUN 14 12/26/2015   CO2 26 12/26/2015   TSH 0.86 12/26/2015    No results found.  Assessment & Plan:   There are no diagnoses linked to this encounter. I am having Ms. Jaimes maintain her amLODipine, cholecalciferol, ergocalciferol, and triamcinolone cream.  No orders of the defined types were placed in this encounter.    Follow-up: No Follow-up on file.  Walker Kehr, MD

## 2016-01-21 NOTE — Progress Notes (Signed)
Pre visit review using our clinic review tool, if applicable. No additional management support is needed unless otherwise documented below in the visit note. 

## 2016-01-21 NOTE — Assessment & Plan Note (Signed)
10/17 hives ?related to Norvasc - will d/c Start Avapro 300 mg/d RTC 6 wks

## 2016-01-21 NOTE — Patient Instructions (Signed)
Use over-the-counter  "cold" medicines  such as "Afrin" nasal spray for nasal congestion as directed instead. Use" Delsym" or" Robitussin" cough syrup varietis for cough.  You can use plain "Tylenol" or "Advil" for fever, chills and achyness. Use Halls or Ricola cough drops.   "Common cold" symptoms are usually triggered by a virus.  The antibiotics are usually not necessary. On average, a" viral cold" illness would take 4-7 days to resolve. Please, make an appointment if you are not better or if you're worse.  

## 2016-01-21 NOTE — Assessment & Plan Note (Signed)
10/17 ?related to Norvasc - will d/c Depo-medrol IM

## 2016-01-22 ENCOUNTER — Ambulatory Visit (AMBULATORY_SURGERY_CENTER): Payer: Self-pay

## 2016-01-22 ENCOUNTER — Encounter: Payer: Self-pay | Admitting: Internal Medicine

## 2016-01-22 VITALS — Ht 63.0 in | Wt 135.0 lb

## 2016-01-22 DIAGNOSIS — J069 Acute upper respiratory infection, unspecified: Secondary | ICD-10-CM | POA: Insufficient documentation

## 2016-01-22 DIAGNOSIS — Z1211 Encounter for screening for malignant neoplasm of colon: Secondary | ICD-10-CM

## 2016-01-22 MED ORDER — SUPREP BOWEL PREP KIT 17.5-3.13-1.6 GM/177ML PO SOLN: 1.0000 | Freq: Once | ORAL | 0 refills | Status: AC

## 2016-01-22 NOTE — Progress Notes (Signed)
No allergies to eggs or soy No past exposure to anesthesia No diet meds No home oxygen  Has email and internet; registered emmi

## 2016-01-22 NOTE — Assessment & Plan Note (Signed)
OTC meds 

## 2016-01-30 ENCOUNTER — Encounter: Payer: Self-pay | Admitting: Internal Medicine

## 2016-02-02 ENCOUNTER — Ambulatory Visit
Admission: RE | Admit: 2016-02-02 | Discharge: 2016-02-02 | Disposition: A | Discharge status: Home / Self Care | Payer: 59 | Source: Ambulatory Visit | Attending: Obstetrics and Gynecology | Admitting: Obstetrics and Gynecology

## 2016-02-02 DIAGNOSIS — Z1231 Encounter for screening mammogram for malignant neoplasm of breast: Secondary | ICD-10-CM

## 2016-02-10 ENCOUNTER — Other Ambulatory Visit: Payer: Self-pay | Admitting: *Deleted

## 2016-02-10 MED ORDER — AMLODIPINE BESYLATE 5 MG PO TABS: 5.0000 mg | ORAL_TABLET | Freq: Every day | ORAL | 2 refills | Status: DC

## 2016-02-12 ENCOUNTER — Telehealth: Payer: Self-pay | Admitting: Internal Medicine

## 2016-02-12 NOTE — Telephone Encounter (Signed)
No

## 2016-02-13 ENCOUNTER — Encounter: Payer: 59 | Admitting: Internal Medicine

## 2016-03-24 ENCOUNTER — Other Ambulatory Visit (INDEPENDENT_AMBULATORY_CARE_PROVIDER_SITE_OTHER): Payer: 59

## 2016-03-24 ENCOUNTER — Ambulatory Visit (INDEPENDENT_AMBULATORY_CARE_PROVIDER_SITE_OTHER): Payer: 59 | Admitting: Internal Medicine

## 2016-03-24 ENCOUNTER — Encounter: Payer: Self-pay | Admitting: Internal Medicine

## 2016-03-24 DIAGNOSIS — I10 Essential (primary) hypertension: Secondary | ICD-10-CM | POA: Diagnosis not present

## 2016-03-24 DIAGNOSIS — L500 Allergic urticaria: Secondary | ICD-10-CM

## 2016-03-24 DIAGNOSIS — L309 Dermatitis, unspecified: Secondary | ICD-10-CM

## 2016-03-24 LAB — BASIC METABOLIC PANEL
BUN: 14 mg/dL (ref 6–23)
CO2: 29 mEq/L (ref 19–32)
Calcium: 9.5 mg/dL (ref 8.4–10.5)
Chloride: 101 mEq/L (ref 96–112)
Creatinine, Ser: 0.72 mg/dL (ref 0.40–1.20)
GFR: 109.46 mL/min (ref >60.00)
Glucose, Bld: 94 mg/dL (ref 70–99)
POTASSIUM: 4.4 meq/L (ref 3.5–5.1)
SODIUM: 136 meq/L (ref 135–145)

## 2016-03-24 MED ORDER — HALOBETASOL PROPIONATE 0.05 % EX CREA: TOPICAL_CREAM | Freq: Two times a day (BID) | CUTANEOUS | 0 refills | Status: DC

## 2016-03-24 MED ORDER — IRBESARTAN-HYDROCHLOROTHIAZIDE 300-12.5 MG PO TABS: 1.0000 | ORAL_TABLET | Freq: Every day | ORAL | 3 refills | Status: DC

## 2016-03-24 MED ORDER — LORATADINE 10 MG PO TABS: 10.0000 mg | ORAL_TABLET | Freq: Every day | ORAL | 3 refills | Status: DC

## 2016-03-24 NOTE — Progress Notes (Signed)
Pre visit review using our clinic review tool, if applicable. No additional management support is needed unless otherwise documented below in the visit note. 

## 2016-03-24 NOTE — Assessment & Plan Note (Addendum)
not related to Norvasc - stopped but it did not help the rash Gluten free trial (no wheat products) for 4-6 weeks. OK to use gluten-free bread and gluten-free pasta.

## 2016-03-24 NOTE — Progress Notes (Signed)
Subjective:  Patient ID: Whitney Patton, female    DOB: 12-21-1965  Age: 50 y.o. MRN: [id number]  CC: No chief complaint on file.   HPI Whitney Patton presents for HTN, rash on hands - not better. Stopping Norvasc did not help the rash/hives F/u Vit D def C/o HAs  Outpatient Medications Prior to Visit  Medication Sig Dispense Refill  . cholecalciferol (VITAMIN D) 1000 units tablet Take 1 tablet (1,000 Units total) by mouth daily. 100 tablet 3  . irbesartan (AVAPRO) 300 MG tablet Take 1 tablet (300 mg total) by mouth daily. 30 tablet 11  . triamcinolone cream (KENALOG) 0.1 % Apply to affected area twice a day as needed 30 g 0  . ergocalciferol (VITAMIN D2) 50000 units capsule Take 1 capsule (50,000 Units total) by mouth once a week. 6 capsule 0  . amLODipine (NORVASC) 5 MG tablet Take 1 tablet (5 mg total) by mouth daily. (Patient not taking: Reported on 03/24/2016) 90 tablet 2   No facility-administered medications prior to visit.     ROS Review of Systems  Constitutional: Negative for activity change, appetite change, chills, fatigue and unexpected weight change.  HENT: Negative for congestion, mouth sores and sinus pressure.   Eyes: Negative for visual disturbance.  Respiratory: Negative for cough and chest tightness.   Gastrointestinal: Negative for abdominal pain and nausea.  Genitourinary: Negative for difficulty urinating, frequency and vaginal pain.  Musculoskeletal: Negative for back pain and gait problem.  Skin: Positive for rash. Negative for pallor.  Neurological: Positive for headaches. Negative for dizziness, tremors, weakness and numbness.  Psychiatric/Behavioral: Negative for confusion and sleep disturbance.    Objective:  BP (!) 190/100   Pulse 82   Wt 136 lb (61.7 kg)   SpO2 98%   BMI 24.09 kg/m   BP Readings from Last 3 Encounters:  03/24/16 (!) 190/100  01/21/16 (!) 154/100  01/16/16 130/64    Wt Readings from Last 3 Encounters:  03/24/16 136 lb  (61.7 kg)  01/22/16 135 lb (61.2 kg)  01/21/16 133 lb (60.3 kg)    Physical Exam  Constitutional: She appears well-developed. No distress.  HENT:  Head: Normocephalic.  Right Ear: External ear normal.  Left Ear: External ear normal.  Nose: Nose normal.  Mouth/Throat: Oropharynx is clear and moist.  Eyes: Conjunctivae are normal. Pupils are equal, round, and reactive to light. Right eye exhibits no discharge. Left eye exhibits no discharge.  Neck: Normal range of motion. Neck supple. No JVD present. No tracheal deviation present. No thyromegaly present.  Cardiovascular: Normal rate, regular rhythm and normal heart sounds.   Pulmonary/Chest: No stridor. No respiratory distress. She has no wheezes.  Abdominal: Soft. Bowel sounds are normal. She exhibits no distension and no mass. There is no tenderness. There is no rebound and no guarding.  Musculoskeletal: She exhibits no edema or tenderness.  Lymphadenopathy:    She has no cervical adenopathy.  Neurological: She displays normal reflexes. No cranial nerve deficit. She exhibits normal muscle tone. Coordination normal.  Skin: No rash noted. No erythema.  Psychiatric: She has a normal mood and affect. Her behavior is normal. Judgment and thought content normal.    Lab Results  Component Value Date   WBC 4.5 12/26/2015   HGB 13.1 12/26/2015   HCT 38.0 12/26/2015   PLT 171.0 12/26/2015   GLUCOSE 103 (H) 12/26/2015   CHOL 163 12/26/2015   TRIG 40.0 12/26/2015   HDL 73.40 12/26/2015   LDLCALC 81 12/26/2015  ALT 11 12/26/2015   AST 15 12/26/2015   NA 135 12/26/2015   K 4.1 12/26/2015   CL 105 12/26/2015   CREATININE 0.73 12/26/2015   BUN 14 12/26/2015   CO2 26 12/26/2015   TSH 0.86 12/26/2015    Mm Digital Screening Bilateral  Result Date: 02/04/2016 CLINICAL DATA:  Screening. EXAM: DIGITAL SCREENING BILATERAL MAMMOGRAM WITH CAD COMPARISON:  Previous exam(s). ACR Breast Density Category c: The breast tissue is  heterogeneously dense, which may obscure small masses. FINDINGS: There are no findings suspicious for malignancy. Images were processed with CAD. IMPRESSION: No mammographic evidence of malignancy. A result letter of this screening mammogram will be mailed directly to the patient. RECOMMENDATION: Screening mammogram in one year. (Code:SM-B-01Y) BI-RADS CATEGORY  1: Negative. Electronically Signed   By: Everlean Alstrom M.D.   On: 02/04/2016 07:40    Assessment & Plan:   There are no diagnoses linked to this encounter. I have discontinued Ms. Cenac ergocalciferol. I am also having her maintain her cholecalciferol, triamcinolone cream, irbesartan, and amLODipine.  No orders of the defined types were placed in this encounter.    Follow-up: No Follow-up on file.  Walker Kehr, MD

## 2016-03-24 NOTE — Assessment & Plan Note (Signed)
Re-start Norvasc Change to [facility] Consider renal art [place]

## 2016-03-24 NOTE — Patient Instructions (Signed)
Gluten free trial (no wheat products) for 4-6 weeks. OK to use gluten-free bread and gluten-free pasta.     

## 2016-03-24 NOTE — Assessment & Plan Note (Signed)
Gluten free trial (no wheat products) for 4-6 weeks. OK to use gluten-free bread and gluten-free pasta. Claritin qd Ultravate Rx

## 2016-03-29 LAB — ALDOSTERONE + RENIN ACTIVITY W/ RATIO
ALDO / PRA Ratio: 9.6 Ratio (ref 0.9–28.9)
Aldosterone: 5 ng/dL
PRA LC/MS/MS: 0.52 ng/mL/h (ref 0.25–5.82)

## 2016-04-07 ENCOUNTER — Telehealth: Payer: Self-pay | Admitting: Internal Medicine

## 2016-04-07 NOTE — Telephone Encounter (Signed)
Patient is asking if there will be an interaction between her taking sudafed sinus 24 hrs- 240 mg tablets and her current medications. Please advise her.

## 2016-04-07 NOTE — Telephone Encounter (Signed)
Patient Name: Whitney Patton  DOB: November 24, 1965    Initial Comment Feels sick with cold s/s and wants to know if she can take Pseudophed.    Nurse Assessment  Nurse: Thad Ranger RN, Denise Date/Time (Eastern Time): 04/07/2016 4:17:25 PM  Confirm and document reason for call. If symptomatic, describe symptoms. ---Feels sick with cold s/s and wants to know if she can take Pseudophed. States she has a hx of HTN. Pt is extremely rude, condescending and refuses to proceed with this call as this RN triage nurse is calling the pt from another state. She demands to speak to someone in the office that has immediate access to her records.  Does the patient have any new or worsening symptoms? ---Yes  Will a triage be completed? ---No  Select reason for no triage. ---Patient declined  Please document clinical information provided and list any resource used. ---Attempted to explain to the pt why this RN is from out of state, but the pt continues to be rude and denies further asst. Trans pt to the MDO for further asst.     Guidelines    Guideline Title Affirmed Question Affirmed Notes       Final Disposition User

## 2016-04-07 NOTE — Telephone Encounter (Signed)
pls stop sudafed Thx

## 2016-04-08 NOTE — Telephone Encounter (Signed)
Use over-the-counter  "cold" medicines  such as  "Afrin" nasal spray for nasal congestion as directed. Use" Delsym" or" Robitussin" cough syrup varietis for cough.  You can use plain "Tylenol" or "Advil" for fever, chills and achyness. Use Halls or Ricola cough drops.   "Common cold" symptoms are usually triggered by a virus.  The antibiotics are usually not necessary. On average, a" viral cold" illness would take 4-7 days to resolve. Please, make an appointment if you are not better or if you're worse.

## 2016-04-08 NOTE — Telephone Encounter (Signed)
Pt is wanting to know what MD recommend for her to take for cold sxs. Chest/Nasal congestion, & headaches...Johny Chess

## 2016-04-09 NOTE — Telephone Encounter (Signed)
Called pt no answer LMOM RTC.../lmb 

## 2016-05-07 ENCOUNTER — Encounter: Payer: Self-pay | Admitting: Internal Medicine

## 2016-05-07 ENCOUNTER — Ambulatory Visit (AMBULATORY_SURGERY_CENTER): Payer: 59 | Admitting: Internal Medicine

## 2016-05-07 VITALS — BP 132/82 | HR 51 | Temp 98.2°F | Resp 8 | Ht 63.0 in | Wt 135.0 lb

## 2016-05-07 DIAGNOSIS — D123 Benign neoplasm of transverse colon: Secondary | ICD-10-CM

## 2016-05-07 DIAGNOSIS — Z1212 Encounter for screening for malignant neoplasm of rectum: Secondary | ICD-10-CM | POA: Diagnosis not present

## 2016-05-07 DIAGNOSIS — Z1211 Encounter for screening for malignant neoplasm of colon: Secondary | ICD-10-CM | POA: Diagnosis not present

## 2016-05-07 HISTORY — PX: COLONOSCOPY: SHX174

## 2016-05-07 MED ORDER — SODIUM CHLORIDE 0.9 % IV SOLN: 500.0000 mL | INTRAVENOUS | Status: DC

## 2016-05-07 NOTE — Progress Notes (Signed)
Patient awakening,vss,report to rn 

## 2016-05-07 NOTE — Op Note (Signed)
[place] Patient Name: Whitney Patton Procedure Date: 05/07/2016 1:40 PM MRN: [id number] Endoscopist: Docia Chuck. Henrene Pastor , MD Age: 51 Referring MD:  Date of Birth: Jun 10, 1965 Gender: Female Account #: [id number] Procedure:                Colonoscopy with cold snare polypectomy x 1 Indications:              Screening for colorectal malignant neoplasm Medicines:                Monitored Anesthesia Care Procedure:                Pre-Anesthesia Assessment:                           - Prior to the procedure, a History and Physical                            was performed, and patient medications and                            allergies were reviewed. The patient's tolerance of                            previous anesthesia was also reviewed. The risks                            and benefits of the procedure and the sedation                            options and risks were discussed with the patient.                            All questions were answered, and informed consent                            was obtained. Prior Anticoagulants: The patient has                            taken no previous anticoagulant or antiplatelet                            agents. ASA Grade Assessment: II - A patient with                            mild systemic disease. After reviewing the risks                            and benefits, the patient was deemed in                            satisfactory condition to undergo the procedure.                           After obtaining informed consent, the colonoscope  was passed under direct vision. Throughout the                            procedure, the patient's blood pressure, pulse, and                            oxygen saturations were monitored continuously. The                            Model CF-HQ190L [id number]) scope was introduced                            through the anus and advanced to the the cecum,             identified by appendiceal orifice and ileocecal                            valve. The ileocecal valve, appendiceal orifice,                            and rectum were photographed. The quality of the                            bowel preparation was excellent. The colonoscopy                            was performed without difficulty. The patient                            tolerated the procedure well. The bowel preparation                            used was SUPREP. Scope In: 1:56:01 PM Scope Out: 2:05:41 PM Scope Withdrawal Time: 0 hours 7 minutes 0 seconds  Total Procedure Duration: 0 hours 9 minutes 40 seconds  Findings:                 A 5 mm polyp was found in the transverse colon. The                            polyp was removed with a cold snare. Resection and                            retrieval were complete.                           The exam was otherwise without abnormality on                            direct and retroflexion views. Complications:            No immediate complications. Estimated blood loss:                            None. Estimated Blood Loss:     Estimated blood  loss: none. Impression:               - One 5 mm polyp in the transverse colon, removed                            with a cold snare. Resected and retrieved.                           - The examination was otherwise normal on direct                            and retroflexion views. Recommendation:           - Repeat colonoscopy in 5 years for surveillance.                           - Patient has a contact number available for                            emergencies. The signs and symptoms of potential                            delayed complications were discussed with the                            patient. Return to normal activities tomorrow.                            Written discharge instructions were provided to the                            patient.                           -  Resume previous diet.                           - Continue present medications.                           - Await pathology results. Docia Chuck. Henrene Pastor, MD 05/07/2016 2:18:27 PM This report has been signed electronically.

## 2016-05-07 NOTE — Patient Instructions (Signed)
YOU HAD AN ENDOSCOPIC PROCEDURE TODAY AT [facility]:   Refer to the procedure report that was given to you for any specific questions about what was found during the examination.  If the procedure report does not answer your questions, please call your gastroenterologist to clarify.  If you requested that your care partner not be given the details of your procedure findings, then the procedure report has been included in a sealed envelope for you to review at your convenience later.  YOU SHOULD EXPECT: Some feelings of bloating in the abdomen. Passage of more gas than usual.  Walking can help get rid of the air that was put into your GI tract during the procedure and reduce the bloating. If you had a lower endoscopy (such as a colonoscopy or flexible sigmoidoscopy) you may notice spotting of blood in your stool or on the toilet paper. If you underwent a bowel prep for your procedure, you may not have a normal bowel movement for a few days.  Please Note:  You might notice some irritation and congestion in your nose or some drainage.  This is from the oxygen used during your procedure.  There is no need for concern and it should clear up in a day or so.  SYMPTOMS TO REPORT IMMEDIATELY:   Following lower endoscopy (colonoscopy or flexible sigmoidoscopy):  Excessive amounts of blood in the stool  Significant tenderness or worsening of abdominal pains  Swelling of the abdomen that is new, acute  Fever of 100F or higher   Following upper endoscopy (EGD)  Vomiting of blood or coffee ground material  New chest pain or pain under the shoulder blades  Painful or persistently difficult swallowing  New shortness of breath  Fever of 100F or higher  Black, tarry-looking stools  For urgent or emergent issues, a gastroenterologist can be reached at any hour by calling [phone].   DIET:  We do recommend a small meal at first, but then you may proceed to your regular diet.  Drink  plenty of fluids but you should avoid alcoholic beverages for 24 hours.  ACTIVITY:  You should plan to take it easy for the rest of today and you should NOT DRIVE or use heavy machinery until tomorrow (because of the sedation medicines used during the test).    FOLLOW UP: Our staff will call the number listed on your records the next business day following your procedure to check on you and address any questions or concerns that you may have regarding the information given to you following your procedure. If we do not reach you, we will leave a message.  However, if you are feeling well and you are not experiencing any problems, there is no need to return our call.  We will assume that you have returned to your regular daily activities without incident.  If any biopsies were taken you will be contacted by phone or by letter within the next 1-3 weeks.  Please call us at [phone] if you have not heard about the biopsies in 3 weeks.    SIGNATURES/CONFIDENTIALITY: You and/or your care partner have signed paperwork which will be entered into your electronic medical record.  These signatures attest to the fact that that the information above on your After Visit Summary has been reviewed and is understood.  Full responsibility of the confidentiality of this discharge information lies with you and/or your care-partner.  Polyp information given.  Recall colonoscopy 5 years-2023

## 2016-05-10 ENCOUNTER — Telehealth: Payer: Self-pay | Admitting: *Deleted

## 2016-05-10 NOTE — Telephone Encounter (Signed)
  Follow up Call-  Call back number 05/07/2016  Post procedure Call Back phone  # [phone]  Permission to leave phone message Yes  Some recent data might be hidden     Patient questions:  Do you have a fever, pain , or abdominal swelling? No. Pain Score  0 *  Have you tolerated food without any problems? Yes.    Have you been able to return to your normal activities? Yes.    Do you have any questions about your discharge instructions: Diet   No. Medications  No. Follow up visit  No.  Do you have questions or concerns about your Care? No.  Actions: * If pain score is 4 or above: No action needed, pain <4.

## 2016-05-13 ENCOUNTER — Encounter: Payer: Self-pay | Admitting: Internal Medicine

## 2016-05-18 ENCOUNTER — Telehealth: Payer: Self-pay | Admitting: *Deleted

## 2016-05-18 MED ORDER — AMLODIPINE BESYLATE 5 MG PO TABS: 5.0000 mg | ORAL_TABLET | Freq: Every day | ORAL | 2 refills | Status: DC

## 2016-05-18 NOTE — Telephone Encounter (Signed)
Rec'd call pt requesting refill on her Amlodipine. Sent rx electronically to [facility].../lmb

## 2016-06-02 ENCOUNTER — Encounter: Payer: Self-pay | Admitting: Internal Medicine

## 2016-06-02 ENCOUNTER — Ambulatory Visit (INDEPENDENT_AMBULATORY_CARE_PROVIDER_SITE_OTHER): Payer: 59 | Admitting: Internal Medicine

## 2016-06-02 DIAGNOSIS — L309 Dermatitis, unspecified: Secondary | ICD-10-CM

## 2016-06-02 DIAGNOSIS — R682 Dry mouth, unspecified: Secondary | ICD-10-CM | POA: Diagnosis not present

## 2016-06-02 DIAGNOSIS — I10 Essential (primary) hypertension: Secondary | ICD-10-CM | POA: Diagnosis not present

## 2016-06-02 NOTE — Assessment & Plan Note (Signed)
[employer] tooth paste Stop using mouthwash

## 2016-06-02 NOTE — Progress Notes (Signed)
Pre-visit discussion using our clinic review tool. No additional management support is needed unless otherwise documented below in the visit note.  

## 2016-06-02 NOTE — Assessment & Plan Note (Signed)
[facility] 

## 2016-06-02 NOTE — Patient Instructions (Addendum)
SebaMed soap and lotion on hands   [employer] Peroxicare toothpaste Stop using a mouthwash

## 2016-06-02 NOTE — Assessment & Plan Note (Signed)
SebaMed soap/lotion Cont w/PRN Ultravate Better

## 2016-06-02 NOTE — Progress Notes (Signed)
Subjective:  Patient ID: Whitney Patton, female    DOB: Sep 10, 1965  Age: 51 y.o. MRN: [id number]  CC: Follow-up Whitney Patton )   HPI Whitney Patton presents for HTN, rash on hands C/o dry mouth  Outpatient Medications Prior to Visit  Medication Sig Dispense Refill  . amLODipine (NORVASC) 5 MG tablet Take 1 tablet (5 mg total) by mouth daily. 90 tablet 2  . cholecalciferol (VITAMIN D) 1000 units tablet Take 1 tablet (1,000 Units total) by mouth daily. 100 tablet 3  . halobetasol (ULTRAVATE) 0.05 % cream Apply topically 2 (two) times daily. 50 g 0  . irbesartan-hydrochlorothiazide (AVALIDE) 300-12.5 MG tablet Take 1 tablet by mouth daily. 90 tablet 3  . loratadine (CLARITIN) 10 MG tablet Take 1 tablet (10 mg total) by mouth daily. 100 tablet 3   Facility-Administered Medications Prior to Visit  Medication Dose Route Frequency Provider Last Rate Last Dose  . 0.9 %  sodium chloride infusion  500 mL Intravenous Continuous Irene Shipper, MD        ROS Review of Systems  Constitutional: Negative for activity change, appetite change, chills, fatigue and unexpected weight change.  HENT: Negative for congestion, mouth sores and sinus pressure.   Eyes: Negative for visual disturbance.  Respiratory: Negative for cough and chest tightness.   Gastrointestinal: Negative for abdominal pain and nausea.  Genitourinary: Negative for difficulty urinating, frequency and vaginal pain.  Musculoskeletal: Negative for back pain and gait problem.  Skin: Positive for rash. Negative for pallor.  Neurological: Negative for dizziness, tremors, weakness, numbness and headaches.  Psychiatric/Behavioral: Negative for confusion and sleep disturbance.    Objective:  BP 126/82   Pulse 82   Temp 97.8 F (36.6 C) (Oral)   Resp 16   Ht 5\' 3"  (1.6 m)   Wt 138 lb 1.3 oz (62.6 kg)   LMP 05/28/2016   SpO2 97%   BMI 24.46 kg/m   BP Readings from Last 3 Encounters:  06/02/16 126/82  05/07/16 132/82  03/24/16  (!) 190/100    Wt Readings from Last 3 Encounters:  06/02/16 138 lb 1.3 oz (62.6 kg)  05/07/16 135 lb (61.2 kg)  03/24/16 136 lb (61.7 kg)    Physical Exam  Constitutional: She appears well-developed. No distress.  HENT:  Head: Normocephalic.  Right Ear: External ear normal.  Left Ear: External ear normal.  Nose: Nose normal.  Mouth/Throat: Oropharynx is clear and moist.  Eyes: Conjunctivae are normal. Pupils are equal, round, and reactive to light. Right eye exhibits no discharge. Left eye exhibits no discharge.  Neck: Normal range of motion. Neck supple. No JVD present. No tracheal deviation present. No thyromegaly present.  Cardiovascular: Normal rate, regular rhythm and normal heart sounds.   Pulmonary/Chest: No stridor. No respiratory distress. She has no wheezes.  Abdominal: Soft. Bowel sounds are normal. She exhibits no distension and no mass. There is no tenderness. There is no rebound and no guarding.  Musculoskeletal: She exhibits no edema or tenderness.  Lymphadenopathy:    She has no cervical adenopathy.  Neurological: She displays normal reflexes. No cranial nerve deficit. She exhibits normal muscle tone. Coordination normal.  Skin: Rash noted. No erythema.  Psychiatric: She has a normal mood and affect. Her behavior is normal. Judgment and thought content normal.  less rash on hands  Lab Results  Component Value Date   WBC 4.5 12/26/2015   HGB 13.1 12/26/2015   HCT 38.0 12/26/2015   PLT 171.0 12/26/2015  GLUCOSE 94 03/24/2016   CHOL 163 12/26/2015   TRIG 40.0 12/26/2015   HDL 73.40 12/26/2015   LDLCALC 81 12/26/2015   ALT 11 12/26/2015   AST 15 12/26/2015   NA 136 03/24/2016   K 4.4 03/24/2016   CL 101 03/24/2016   CREATININE 0.72 03/24/2016   BUN 14 03/24/2016   CO2 29 03/24/2016   TSH 0.86 12/26/2015    Mm Digital Screening Bilateral  Result Date: 02/02/2016 CLINICAL DATA:  Screening. EXAM: DIGITAL SCREENING BILATERAL MAMMOGRAM WITH CAD  COMPARISON:  Previous exam(s). ACR Breast Density Category c: The breast tissue is heterogeneously dense, which may obscure small masses. FINDINGS: There are no findings suspicious for malignancy. Images were processed with CAD. IMPRESSION: No mammographic evidence of malignancy. A result letter of this screening mammogram will be mailed directly to the patient. RECOMMENDATION: Screening mammogram in one year. (Code:SM-B-01Y) BI-RADS CATEGORY  1: Negative. Electronically Signed   By: Everlean Alstrom M.D.   On: 02/04/2016 07:40    Assessment & Plan:   There are no diagnoses linked to this encounter. I have discontinued Whitney Patton loratadine. I am also having her maintain her cholecalciferol, irbesartan-hydrochlorothiazide, halobetasol, and amLODipine. We will continue to administer sodium chloride.  No orders of the defined types were placed in this encounter.    Follow-up: No Follow-up on file.  Walker Kehr, MD

## 2016-09-01 ENCOUNTER — Encounter: Payer: Self-pay | Admitting: Gynecology

## 2016-09-01 DIAGNOSIS — Z79899 Other long term (current) drug therapy: Secondary | ICD-10-CM | POA: Diagnosis not present

## 2016-09-01 DIAGNOSIS — G44219 Episodic tension-type headache, not intractable: Secondary | ICD-10-CM | POA: Diagnosis not present

## 2016-09-01 DIAGNOSIS — I10 Essential (primary) hypertension: Secondary | ICD-10-CM | POA: Diagnosis not present

## 2016-11-17 ENCOUNTER — Other Ambulatory Visit: Payer: Self-pay | Admitting: Internal Medicine

## 2016-11-26 ENCOUNTER — Encounter: Payer: Self-pay | Admitting: Podiatry

## 2016-11-26 ENCOUNTER — Ambulatory Visit (INDEPENDENT_AMBULATORY_CARE_PROVIDER_SITE_OTHER): Payer: 59 | Admitting: Podiatry

## 2016-11-26 VITALS — BP 134/95 | HR 82

## 2016-11-26 DIAGNOSIS — B351 Tinea unguium: Secondary | ICD-10-CM

## 2016-11-26 MED ORDER — TERBINAFINE HCL 250 MG PO TABS: 250.0000 mg | ORAL_TABLET | Freq: Every day | ORAL | 0 refills | Status: DC

## 2016-11-26 NOTE — Progress Notes (Signed)
   Subjective:    Patient ID: Whitney Patton, female    DOB: Mar 18, 1966, 51 y.o.   MRN: [id number]  HPI  Chief Complaint  Patient presents with  . Nail Problem    B/L big ote nails discolored onset chronic       Review of Systems  All other systems reviewed and are negative.      Objective:   Physical Exam        Assessment & Plan:

## 2016-11-28 NOTE — Progress Notes (Signed)
Subjective:    Patient ID: Whitney Patton, female   DOB: 51 y.o.   MRN: [id number]   HPI patient presents with fungal changes in the hallux bilateral    Review of Systems  All other systems reviewed and are negative.       Objective:  Physical Exam  Constitutional: She appears well-developed and well-nourished.  Cardiovascular: Intact distal pulses.   Musculoskeletal: Normal range of motion.  Neurological: She is alert.  Skin: Skin is warm and dry.  Nursing note and vitals reviewed.  neurovascular status found to be intact with muscle strength adequate range of motion within normal limits. Patient's found have discoloration the hallux nails bilateral with yellow type debris but no significant thickness of the nailbeds or other pathology from that standpoint. 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 I've recommended a combination of topical therapy laser therapy and oral and I reviewed Lamisil and it's risk. Patient wants Lamisil and she will take 1 pill a day 250 mg for 90 days and I did send her today for liver function studies. We will initiate laser and I estimate it will require 3-4 treatments and I do think she had reasonably good chance of improvement

## 2016-11-30 ENCOUNTER — Emergency Department (HOSPITAL_COMMUNITY): Payer: 59

## 2016-11-30 ENCOUNTER — Emergency Department (HOSPITAL_COMMUNITY)
Admission: EM | Admit: 2016-11-30 | Discharge: 2016-11-30 | Disposition: A | Discharge status: Home / Self Care | Payer: 59 | Attending: Emergency Medicine | Admitting: Emergency Medicine

## 2016-11-30 ENCOUNTER — Encounter (HOSPITAL_COMMUNITY): Payer: Self-pay

## 2016-11-30 DIAGNOSIS — I10 Essential (primary) hypertension: Secondary | ICD-10-CM | POA: Diagnosis not present

## 2016-11-30 DIAGNOSIS — Y9389 Activity, other specified: Secondary | ICD-10-CM | POA: Diagnosis not present

## 2016-11-30 DIAGNOSIS — Y999 Unspecified external cause status: Secondary | ICD-10-CM | POA: Diagnosis not present

## 2016-11-30 DIAGNOSIS — Y929 Unspecified place or not applicable: Secondary | ICD-10-CM | POA: Insufficient documentation

## 2016-11-30 DIAGNOSIS — S9001XA Contusion of right ankle, initial encounter: Secondary | ICD-10-CM | POA: Diagnosis not present

## 2016-11-30 DIAGNOSIS — F17210 Nicotine dependence, cigarettes, uncomplicated: Secondary | ICD-10-CM | POA: Diagnosis not present

## 2016-11-30 DIAGNOSIS — S99911A Unspecified injury of right ankle, initial encounter: Secondary | ICD-10-CM | POA: Diagnosis present

## 2016-11-30 DIAGNOSIS — W228XXA Striking against or struck by other objects, initial encounter: Secondary | ICD-10-CM | POA: Insufficient documentation

## 2016-11-30 DIAGNOSIS — Z79899 Other long term (current) drug therapy: Secondary | ICD-10-CM | POA: Diagnosis not present

## 2016-11-30 DIAGNOSIS — M25571 Pain in right ankle and joints of right foot: Secondary | ICD-10-CM | POA: Diagnosis not present

## 2016-11-30 MED ORDER — DICLOFENAC SODIUM 25 MG PO TBEC: 25.0000 mg | DELAYED_RELEASE_TABLET | Freq: Two times a day (BID) | ORAL | 0 refills | Status: DC

## 2016-11-30 MED ORDER — IBUPROFEN 200 MG PO TABS
600.0000 mg | ORAL_TABLET | Freq: Once | ORAL | Status: DC
  Filled 2016-11-30: qty 3
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
## 2016-11-30 NOTE — ED Notes (Signed)
Pt requesting to speak with registration prior to d/c. Registration made aware.
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
## 2016-12-01 ENCOUNTER — Encounter: Payer: Self-pay | Admitting: Internal Medicine

## 2016-12-01 ENCOUNTER — Ambulatory Visit (INDEPENDENT_AMBULATORY_CARE_PROVIDER_SITE_OTHER): Payer: 59 | Admitting: Internal Medicine

## 2016-12-01 ENCOUNTER — Other Ambulatory Visit (INDEPENDENT_AMBULATORY_CARE_PROVIDER_SITE_OTHER): Payer: 59

## 2016-12-01 DIAGNOSIS — B351 Tinea unguium: Secondary | ICD-10-CM | POA: Diagnosis not present

## 2016-12-01 DIAGNOSIS — H6120 Impacted cerumen, unspecified ear: Secondary | ICD-10-CM | POA: Insufficient documentation

## 2016-12-01 DIAGNOSIS — E559 Vitamin D deficiency, unspecified: Secondary | ICD-10-CM

## 2016-12-01 DIAGNOSIS — H6123 Impacted cerumen, bilateral: Secondary | ICD-10-CM | POA: Diagnosis not present

## 2016-12-01 DIAGNOSIS — I10 Essential (primary) hypertension: Secondary | ICD-10-CM

## 2016-12-01 LAB — BASIC METABOLIC PANEL
BUN: 12 mg/dL (ref 6–23)
CHLORIDE: 102 meq/L (ref 96–112)
CO2: 30 mEq/L (ref 19–32)
Calcium: 9.2 mg/dL (ref 8.4–10.5)
Creatinine, Ser: 0.72 mg/dL (ref 0.40–1.20)
GFR: 109.17 mL/min (ref >60.00)
GLUCOSE: 93 mg/dL (ref 70–99)
POTASSIUM: 4 meq/L (ref 3.5–5.1)
Sodium: 136 mEq/L (ref 135–145)

## 2016-12-01 LAB — CBC WITH DIFFERENTIAL/PLATELET
Basophils Absolute: 0 10*3/uL (ref 0.0–0.1)
Basophils Relative: 0.9 % (ref 0.0–3.0)
EOS PCT: 1.8 % (ref 0.0–5.0)
Eosinophils Absolute: 0.1 10*3/uL (ref 0.0–0.7)
HCT: 38.9 % (ref 36.0–46.0)
HEMOGLOBIN: 12.9 g/dL (ref 12.0–15.0)
LYMPHS ABS: 2.5 10*3/uL (ref 0.7–4.0)
Lymphocytes Relative: 47.1 % — ABNORMAL HIGH (ref 12.0–46.0)
MCHC: 33 g/dL (ref 30.0–36.0)
MCV: 86.6 fl (ref 78.0–100.0)
MONOS PCT: 8.6 % (ref 3.0–12.0)
Monocytes Absolute: 0.5 10*3/uL (ref 0.1–1.0)
Neutro Abs: 2.2 10*3/uL (ref 1.4–7.7)
Neutrophils Relative %: 41.6 % — ABNORMAL LOW (ref 43.0–77.0)
Platelets: 233 10*3/uL (ref 150.0–400.0)
RBC: 4.5 Mil/uL (ref 3.87–5.11)
RDW: 12.9 % (ref 11.5–15.5)
WBC: 5.3 10*3/uL (ref 4.0–10.5)

## 2016-12-01 LAB — VITAMIN B12: Vitamin B-12: 374 pg/mL (ref 211–911)

## 2016-12-01 LAB — VITAMIN D 25 HYDROXY (VIT D DEFICIENCY, FRACTURES): VITD: 24.23 ng/mL — ABNORMAL LOW (ref 30.00–100.00)

## 2016-12-01 MED ORDER — CICLOPIROX 8 % EX SOLN: Freq: Every day | CUTANEOUS | 0 refills | Status: DC

## 2016-12-01 MED ORDER — FINASTERIDE 5 MG PO TABS: ORAL_TABLET | ORAL | 5 refills | Status: DC

## 2016-12-01 NOTE — Progress Notes (Signed)
Subjective:  Patient ID: Whitney Patton, female    DOB: 1965-11-11  Age: 51 y.o. MRN: [id number]  CC: No chief complaint on file.   HPI Whitney Patton presents for R ankle contusion at work - an O2 tank fell on the R ankle F/u HTN, Vit D def C/o onychomycosis  Outpatient Medications Prior to Visit  Medication Sig Dispense Refill  . amLODipine (NORVASC) 5 MG tablet Take 1 tablet (5 mg total) by mouth daily. 90 tablet 2  . Biotin 1000 MCG tablet Take 1,000 mcg by mouth 2 (two) times daily.    . cholecalciferol (VITAMIN D) 1000 units tablet Take 1 tablet (1,000 Units total) by mouth daily. 100 tablet 3  . Dermatological Products, Misc. (NONYX) GEL Apply topically.    . diclofenac (VOLTAREN) 25 MG EC tablet Take 1 tablet (25 mg total) by mouth 2 (two) times daily. 15 tablet 0  . halobetasol (ULTRAVATE) 0.05 % cream APPLY TOPICALLY 2 (TWO) TIMES DAILY. 50 g 0  . irbesartan-hydrochlorothiazide (AVALIDE) 300-12.5 MG tablet Take 1 tablet by mouth daily. 90 tablet 3  . terbinafine (LAMISIL) 250 MG tablet Take 1 tablet (250 mg total) by mouth daily. (Patient not taking: Reported on 12/01/2016) 90 tablet 0   Facility-Administered Medications Prior to Visit  Medication Dose Route Frequency Provider Last Rate Last Dose  . 0.9 %  sodium chloride infusion  500 mL Intravenous Continuous Irene Shipper, MD        ROS Review of Systems  Constitutional: Negative for activity change, appetite change, chills, fatigue and unexpected weight change.  HENT: Negative for congestion, mouth sores and sinus pressure.   Eyes: Negative for visual disturbance.  Respiratory: Negative for cough and chest tightness.   Gastrointestinal: Negative for abdominal pain and nausea.  Genitourinary: Negative for difficulty urinating, frequency and vaginal pain.  Musculoskeletal: Negative for back pain and gait problem.  Skin: Negative for pallor and rash.  Neurological: Negative for dizziness, tremors, weakness, numbness  and headaches.  Psychiatric/Behavioral: Negative for confusion and sleep disturbance.    Objective:  BP 118/74 (BP Location: Left Arm, Patient Position: Sitting, Cuff Size: Normal)   Pulse 81   Temp 97.8 F (36.6 C) (Oral)   Ht 5\' 3"  (1.6 m)   Wt 138 lb (62.6 kg)   LMP 11/23/2016   SpO2 98%   BMI 24.45 kg/m   BP Readings from Last 3 Encounters:  12/01/16 118/74  11/30/16 120/84  11/26/16 (!) 134/95    Wt Readings from Last 3 Encounters:  12/01/16 138 lb (62.6 kg)  06/02/16 138 lb 1.3 oz (62.6 kg)  05/07/16 135 lb (61.2 kg)    Physical Exam  Constitutional: She appears well-developed. No distress.  HENT:  Head: Normocephalic.  Right Ear: External ear normal.  Left Ear: External ear normal.  Nose: Nose normal.  Mouth/Throat: Oropharynx is clear and moist.  Eyes: Pupils are equal, round, and reactive to light. Conjunctivae are normal. Right eye exhibits no discharge. Left eye exhibits no discharge.  Neck: Normal range of motion. Neck supple. No JVD present. No tracheal deviation present. No thyromegaly present.  Cardiovascular: Normal rate, regular rhythm and normal heart sounds.   Pulmonary/Chest: No stridor. No respiratory distress. She has no wheezes.  Abdominal: Soft. Bowel sounds are normal. She exhibits no distension and no mass. There is no tenderness. There is no rebound and no guarding.  Musculoskeletal: She exhibits tenderness. She exhibits no edema.  Lymphadenopathy:    She has no  cervical adenopathy.  Neurological: She displays normal reflexes. No cranial nerve deficit. She exhibits normal muscle tone. Coordination normal.  Skin: No rash noted. No erythema.  Psychiatric: She has a normal mood and affect. Her behavior is normal. Judgment and thought content normal.  R ankle - tender; ACE wrap is on onycho B wax   Lab Results  Component Value Date   WBC 4.5 12/26/2015   HGB 13.1 12/26/2015   HCT 38.0 12/26/2015   PLT 171.0 12/26/2015   GLUCOSE 94  03/24/2016   CHOL 163 12/26/2015   TRIG 40.0 12/26/2015   HDL 73.40 12/26/2015   LDLCALC 81 12/26/2015   ALT 11 12/26/2015   AST 15 12/26/2015   NA 136 03/24/2016   K 4.4 03/24/2016   CL 101 03/24/2016   CREATININE 0.72 03/24/2016   BUN 14 03/24/2016   CO2 29 03/24/2016   TSH 0.86 12/26/2015    Dg Ankle Complete Right  Result Date: 11/30/2016 CLINICAL DATA:  Right ankle pain and swelling after injury today. EXAM: RIGHT ANKLE - COMPLETE 3+ VIEW COMPARISON:  None. FINDINGS: There is no evidence of fracture, dislocation, or joint effusion. There is no evidence of arthropathy or other focal bone abnormality. Soft tissues are unremarkable. IMPRESSION: Normal right ankle. Electronically Signed   By: Marijo Conception, M.D.   On: 11/30/2016 16:24    Assessment & Plan:   There are no diagnoses linked to this encounter. I am having Ms. Wardrip maintain her cholecalciferol, irbesartan-hydrochlorothiazide, amLODipine, halobetasol, Biotin, NONYX, terbinafine, and diclofenac. We will continue to administer sodium chloride.  No orders of the defined types were placed in this encounter.    Follow-up: No Follow-up on file.  Walker Kehr, MD

## 2016-12-01 NOTE — Assessment & Plan Note (Signed)
B ears

## 2016-12-01 NOTE — Assessment & Plan Note (Signed)
Dr Edson Snowball [place]

## 2016-12-01 NOTE — Assessment & Plan Note (Signed)
Vit D Vit B complex

## 2016-12-01 NOTE — Assessment & Plan Note (Signed)
Norvasc [facility]

## 2016-12-02 ENCOUNTER — Other Ambulatory Visit: Payer: Self-pay | Admitting: Internal Medicine

## 2016-12-02 MED ORDER — VITAMIN D 1000 UNITS PO TABS: 2000.0000 [IU] | ORAL_TABLET | Freq: Every day | ORAL | 3 refills | Status: AC

## 2016-12-02 MED ORDER — VITAMIN D3 1.25 MG (50000 UT) PO CAPS: 1.0000 | ORAL_CAPSULE | ORAL | 0 refills | Status: DC

## 2016-12-05 ENCOUNTER — Encounter: Payer: Self-pay | Admitting: Internal Medicine

## 2016-12-06 ENCOUNTER — Other Ambulatory Visit: Payer: 59

## 2017-01-03 ENCOUNTER — Ambulatory Visit (INDEPENDENT_AMBULATORY_CARE_PROVIDER_SITE_OTHER): Payer: 59 | Admitting: Internal Medicine

## 2017-01-03 ENCOUNTER — Encounter: Payer: Self-pay | Admitting: Internal Medicine

## 2017-01-03 DIAGNOSIS — S8011XS Contusion of right lower leg, sequela: Secondary | ICD-10-CM | POA: Diagnosis not present

## 2017-01-03 NOTE — Patient Instructions (Addendum)
Rice sock heating pad     Hematoma A hematoma is a collection of blood under the skin, in an organ, in a body space, in a joint space, or in other tissue. The blood can clot to form a lump that you can see and feel. The lump is often firm and may sometimes become sore and tender. Most hematomas get better in a few days to weeks. However, some hematomas may be serious and require medical care. Hematomas can range in size from very small to very large. What are the causes? A hematoma can be caused by a blunt or penetrating injury. It can also be caused by spontaneous leakage from a blood vessel under the skin. Spontaneous leakage from a blood vessel is more likely to occur in older people, especially those taking blood thinners. Sometimes, a hematoma can develop after certain medical procedures. What are the signs or symptoms?  A firm lump on the body.  Possible pain and tenderness in the area.  Bruising.Blue, dark blue, purple-red, or yellowish skin may appear at the site of the hematoma if the hematoma is close to the surface of the skin. For hematomas in deeper tissues or body spaces, the signs and symptoms may be subtle. For example, an intra-abdominal hematoma may cause abdominal pain, weakness, fainting, and shortness of breath. An intracranial hematoma may cause a headache or symptoms such as weakness, trouble speaking, or a change in consciousness. How is this diagnosed? A hematoma can usually be diagnosed based on your medical history and a physical exam. Imaging tests may be needed if your health care provider suspects a hematoma in deeper tissues or body spaces, such as the abdomen, head, or chest. These tests may include ultrasonography or a CT scan. How is this treated? Hematomas usually go away on their own over time. Rarely does the blood need to be drained out of the body. Large hematomas or those that may affect vital organs will sometimes need surgical drainage or  monitoring. Follow these instructions at home:  Apply ice to the injured area: ? Put ice in a plastic bag. ? Place a towel between your skin and the bag. ? Leave the ice on for 20 minutes, 2-3 times a day for the first 1 to 2 days.  After the first 2 days, switch to using warm compresses on the hematoma.  Elevate the injured area to help decrease pain and swelling. Wrapping the area with an elastic bandage may also be helpful. Compression helps to reduce swelling and promotes shrinking of the hematoma. Make sure the bandage is not wrapped too tight.  If your hematoma is on a lower extremity and is painful, crutches may be helpful for a couple days.  Only take over-the-counter or prescription medicines as directed by your health care provider. Get help right away if:  You have increasing pain, or your pain is not controlled with medicine.  You have a fever.  You have worsening swelling or discoloration.  Your skin over the hematoma breaks or starts bleeding.  Your hematoma is in your chest or abdomen and you have weakness, shortness of breath, or a change in consciousness.  Your hematoma is on your scalp (caused by a fall or injury) and you have a worsening headache or a change in alertness or consciousness. This information is not intended to replace advice given to you by your health care provider. Make sure you discuss any questions you have with your health care provider. Document Released: 11/18/2003 Document  Revised: 09/11/2015 Document Reviewed: 09/13/2012 Elsevier Interactive Patient Education  2017 [employer].

## 2017-01-03 NOTE — Assessment & Plan Note (Signed)
Rice sock bag Sports med ref
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
## 2017-01-13 ENCOUNTER — Telehealth: Payer: Self-pay | Admitting: Internal Medicine

## 2017-01-13 NOTE — Telephone Encounter (Signed)
error 

## 2017-01-19 ENCOUNTER — Ambulatory Visit: Payer: 59 | Admitting: Family Medicine

## 2017-01-19 ENCOUNTER — Ambulatory Visit (INDEPENDENT_AMBULATORY_CARE_PROVIDER_SITE_OTHER): Payer: 59 | Admitting: Obstetrics and Gynecology

## 2017-01-19 ENCOUNTER — Encounter: Payer: Self-pay | Admitting: Obstetrics and Gynecology

## 2017-01-19 VITALS — BP 116/72 | HR 80 | Resp 16 | Ht 63.75 in | Wt 142.0 lb

## 2017-01-19 DIAGNOSIS — Z01419 Encounter for gynecological examination (general) (routine) without abnormal findings: Secondary | ICD-10-CM | POA: Diagnosis not present

## 2017-01-19 DIAGNOSIS — E049 Nontoxic goiter, unspecified: Secondary | ICD-10-CM

## 2017-01-19 NOTE — Patient Instructions (Signed)

## 2017-01-19 NOTE — Addendum Note (Signed)
Addended by: Gwendlyn Deutscher on: 01/19/2017 03:02 PM   Modules accepted: Orders

## 2017-01-19 NOTE — Progress Notes (Addendum)
51 y.o. [id number] MarriedAfrican AmericanF here for annual exam.  No dyspareunia.  She was having vasomotor symptoms, but they are currently better.  Period Cycle (Days): 28 Period Duration (Days): 7 Period Pattern: Regular Menstrual Flow: Moderate Menstrual Control: Panty liner, Maxi pad Menstrual Control Change Freq (Hours): 4-5 Dysmenorrhea: None  Patient's last menstrual period was 01/16/2017.          Sexually active: Yes.    The current method of family planning is none.    Exercising: No.  The patient does not participate in regular exercise at present. Smoker: Yes, smoking 3-4 cigarettes a day.  Health Maintenance: Pap:  01/14/16 Pap and HR HPV negative History of abnormal Pap:  Yes, years ago MMG:  02/02/16 BIRADS 1 negative/density c Colonoscopy:  05/07/16 Polyp removed -- normal repeat 5 years BMD:   Never TDaP:  12/24/15 Gardasil: N/a   reports that she has been smoking Cigarettes.  She has been smoking about 0.25 packs per day. She has never used smokeless tobacco. She reports that she drinks about 4.2 - 8.4 oz of alcohol per week . She reports that she does not use drugs. She drinks about 8 drinks a week. Daughter is 33 doing better, her 21 year old son is with her, middle son is with his dad and oldest son is in jail. The patient is a hair stylist.  Also close to her middle grandsons 2 brothers on his Dad's side.   Past Medical History:  Diagnosis Date  . Allergy   . Hypertension     History reviewed. No pertinent surgical history.  Current Outpatient Prescriptions  Medication Sig Dispense Refill  . amLODipine (NORVASC) 5 MG tablet Take 1 tablet (5 mg total) by mouth daily. 90 tablet 2  . Biotin 1000 MCG tablet Take 1,000 mcg by mouth 2 (two) times daily.    . Cholecalciferol (VITAMIN D3) 50000 units CAPS Take 1 capsule by mouth once a week. 8 capsule 0  . ciclopirox (PENLAC) 8 % solution Apply topically at bedtime. Apply over nail and surrounding skin. Apply daily  over previous coat. After seven (7) days, may remove with alcohol and continue cycle. 6.6 mL 0  . Dermatological Products, Misc. (NONYX) GEL Apply topically.    . diclofenac (VOLTAREN) 25 MG EC tablet Take 1 tablet (25 mg total) by mouth 2 (two) times daily. 15 tablet 0  . halobetasol (ULTRAVATE) 0.05 % cream APPLY TOPICALLY 2 (TWO) TIMES DAILY. 50 g 0  . irbesartan-hydrochlorothiazide (AVALIDE) 300-12.5 MG tablet Take 1 tablet by mouth daily. 90 tablet 3  . terbinafine (LAMISIL) 250 MG tablet Take 1 tablet (250 mg total) by mouth daily. 90 tablet 0  . cholecalciferol (VITAMIN D) 1000 units tablet Take 2 tablets (2,000 Units total) by mouth daily. (Patient not taking: Reported on 01/19/2017) 100 tablet 3   Current Facility-Administered Medications  Medication Dose Route Frequency Provider Last Rate Last Dose  . 0.9 %  sodium chloride infusion  500 mL Intravenous Continuous Irene Shipper, MD        Family History  Problem Relation Age of Onset  . Lung cancer Mother   . Diabetes Father   . Diabetes Brother   . Colon cancer Neg Hx   . Colon polyps Neg Hx   . Esophageal cancer Neg Hx   . Rectal cancer Neg Hx   . Stomach cancer Neg Hx     Review of Systems  Constitutional: Negative.   HENT: Negative.   Eyes:  Negative.   Respiratory: Negative.   Cardiovascular: Negative.   Gastrointestinal: Negative.   Endocrine: Negative.   Genitourinary: Negative.   Musculoskeletal: Negative.   Skin: Negative.   Allergic/Immunologic: Negative.   Neurological: Negative.   Hematological: Negative.   Psychiatric/Behavioral: Negative.     Exam:   BP 116/72 (BP Location: Right Arm, Patient Position: Sitting, Cuff Size: Normal)   Pulse 80   Resp 16   Ht 5' 3.75" (1.619 m)   Wt 142 lb (64.4 kg)   LMP 01/16/2017   BMI 24.57 kg/m   Weight change: @WEIGHTCHANGE @ Height:   Height: 5' 3.75" (161.9 cm)  Ht Readings from Last 3 Encounters:  01/19/17 5' 3.75" (1.619 m)  01/03/17 5\' 3"  (1.6 m)   12/01/16 5\' 3"  (1.6 m)    General appearance: alert, cooperative and appears stated age Head: Normocephalic, without obvious abnormality, atraumatic Neck: no adenopathy, supple, symmetrical, trachea midline and thyroid right thyroid lobe larger than the left Lungs: clear to auscultation bilaterally Cardiovascular: regular rate and rhythm Breasts: normal appearance, no masses or tenderness Abdomen: soft, non-tender; non distended,  no masses,  no organomegaly Extremities: extremities normal, atraumatic, no cyanosis or edema Skin: Skin color, texture, turgor normal. No rashes or lesions Lymph nodes: Cervical, supraclavicular, and axillary nodes normal. No abnormal inguinal nodes palpated Neurologic: Grossly normal   Pelvic: External genitalia:  no lesions              Urethra:  normal appearing urethra with no masses, tenderness or lesions              Bartholins and Skenes: normal                 Vagina: normal appearing vagina with normal color and discharge, no lesions              Cervix: no lesions               Bimanual Exam:  Uterus:  normal size, contour, position, consistency, mobility, non-tender              Adnexa: no mass, fullness, tenderness               Rectovaginal: Confirms               Anus:  normal sphincter tone, no lesions  Chaperone was present for exam.  A:  Well Woman exam  Enlarged right lobe of thyroid  P:   Labs and immunizations with primary  Mammogram this month  Discussed breast self exam  Discussed calcium and vit D intake  No pap this year  TFT's, thyroid ultrasound

## 2017-01-19 NOTE — Progress Notes (Signed)
Scheduled patient while in office for thyroid ultrasound at [facility] on 01/26/2017 at 2:45 pm. Patient is agreeable to date and time.

## 2017-01-20 ENCOUNTER — Other Ambulatory Visit: Payer: Self-pay | Admitting: *Deleted

## 2017-01-20 LAB — THYROID PANEL WITH TSH
Free Thyroxine Index: 1.8 (ref 1.2–4.9)
T3 Uptake Ratio: 31 % (ref 24–39)
T4, Total: 5.9 ug/dL (ref 4.5–12.0)
TSH: 0.829 u[IU]/mL (ref 0.450–4.500)

## 2017-01-25 ENCOUNTER — Other Ambulatory Visit: Payer: Self-pay | Admitting: Internal Medicine

## 2017-01-25 ENCOUNTER — Other Ambulatory Visit: Payer: Self-pay | Admitting: Obstetrics and Gynecology

## 2017-01-25 DIAGNOSIS — Z1231 Encounter for screening mammogram for malignant neoplasm of breast: Secondary | ICD-10-CM

## 2017-01-26 ENCOUNTER — Ambulatory Visit
Admission: RE | Admit: 2017-01-26 | Discharge: 2017-01-26 | Disposition: A | Discharge status: Home / Self Care | Payer: 59 | Source: Ambulatory Visit | Attending: Obstetrics and Gynecology | Admitting: Obstetrics and Gynecology

## 2017-01-26 DIAGNOSIS — E049 Nontoxic goiter, unspecified: Secondary | ICD-10-CM

## 2017-01-26 DIAGNOSIS — E042 Nontoxic multinodular goiter: Secondary | ICD-10-CM | POA: Diagnosis not present

## 2017-01-27 ENCOUNTER — Other Ambulatory Visit: Payer: Self-pay | Admitting: *Deleted

## 2017-01-27 DIAGNOSIS — E041 Nontoxic single thyroid nodule: Secondary | ICD-10-CM

## 2017-02-04 ENCOUNTER — Encounter: Payer: Self-pay | Admitting: Endocrinology

## 2017-02-04 ENCOUNTER — Ambulatory Visit (INDEPENDENT_AMBULATORY_CARE_PROVIDER_SITE_OTHER): Payer: 59 | Admitting: Endocrinology

## 2017-02-04 ENCOUNTER — Other Ambulatory Visit (HOSPITAL_COMMUNITY)
Admission: RE | Admit: 2017-02-04 | Discharge: 2017-02-04 | Disposition: A | Discharge status: Home / Self Care | Payer: 59 | Source: Ambulatory Visit | Attending: Endocrinology | Admitting: Endocrinology

## 2017-02-04 DIAGNOSIS — E042 Nontoxic multinodular goiter: Secondary | ICD-10-CM | POA: Diagnosis not present

## 2017-02-04 DIAGNOSIS — E041 Nontoxic single thyroid nodule: Secondary | ICD-10-CM | POA: Diagnosis not present

## 2017-02-04 NOTE — Patient Instructions (Signed)
We'll let you know about the biopsy results. Please come back for a follow-up appointment in 1 year. most of the time, a "lumpy thyroid" will eventually become overactive.  this is usually a slow process, happening over the span of many years.

## 2017-02-04 NOTE — Progress Notes (Signed)
Subjective:    Patient ID: Whitney Patton, female    DOB: [birth year], 51 y.o.   MRN: [id number]  HPI Pt is referred by Dr Talbert Nan, for nodular thyroid.  Pt was noted to have a nodular thyroid in 2001.  She has intermitt slight pain at the anterior neck, but no assoc swelling.  she has no h/o XRT or surgery to the neck.   Past Medical History:  Diagnosis Date  . Allergy   . Hypertension     No past surgical history on file.  Social History   Social History  . Marital status: Married    Spouse name: N/A  . Number of children: N/A  . Years of education: N/A   Occupational History  . Not on file.   Social History Main Topics  . Smoking status: Current Every Day Smoker    Packs/day: 0.25    Types: Cigarettes  . Smokeless tobacco: Never Used  . Alcohol use 4.2 - 8.4 oz/week    7 - 14 Glasses of wine per week  . Drug use: No  . Sexual activity: Yes    Partners: Male    Birth control/ protection: None   Other Topics Concern  . Not on file   Social History Narrative  . No narrative on file    Current Outpatient Prescriptions on File Prior to Visit  Medication Sig Dispense Refill  . amLODipine (NORVASC) 5 MG tablet Take 1 tablet (5 mg total) by mouth daily. 90 tablet 2  . Biotin 1000 MCG tablet Take 1,000 mcg by mouth 2 (two) times daily.    . ciclopirox (PENLAC) 8 % solution Apply topically at bedtime. Apply over nail and surrounding skin. Apply daily over previous coat. After seven (7) days, may remove with alcohol and continue cycle. 6.6 mL 0  . Dermatological Products, Misc. (NONYX) GEL Apply topically.    . halobetasol (ULTRAVATE) 0.05 % cream APPLY TOPICALLY 2 (TWO) TIMES DAILY. 50 g 0  . irbesartan-hydrochlorothiazide (AVALIDE) 300-12.5 MG tablet Take 1 tablet by mouth daily. 90 tablet 3  . terbinafine (LAMISIL) 250 MG tablet Take 1 tablet (250 mg total) by mouth daily. 90 tablet 0  . cholecalciferol (VITAMIN D) 1000 units tablet Take 2 tablets (2,000 Units total)  by mouth daily. (Patient not taking: Reported on 02/04/2017) 100 tablet 3  . D3-50 50000 units capsule TAKE ONE CAPSULE BY MOUTH ONE TIME PER WEEK (Patient not taking: Reported on 02/04/2017) 8 capsule 0  . diclofenac (VOLTAREN) 25 MG EC tablet Take 1 tablet (25 mg total) by mouth 2 (two) times daily. (Patient not taking: Reported on 02/04/2017) 15 tablet 0   Current Facility-Administered Medications on File Prior to Visit  Medication Dose Route Frequency Provider Last Rate Last Dose  . 0.9 %  sodium chloride infusion  500 mL Intravenous Continuous Irene Shipper, MD        No Known Allergies  Family History  Problem Relation Age of Onset  . Lung cancer Mother   . Diabetes Father   . Diabetes Brother   . Colon cancer Neg Hx   . Colon polyps Neg Hx   . Esophageal cancer Neg Hx   . Rectal cancer Neg Hx   . Stomach cancer Neg Hx   . Thyroid disease Neg Hx     BP 128/74   Pulse 77   Wt 143 lb (64.9 kg)   LMP 01/16/2017   SpO2 97%   BMI 24.74 kg/m  Review of Systems Denies weight change, hoarseness, visual loss, chest pain, sob, cough, dysphagia, diarrhea, itching, flushing, easy bruising, depression, cold intolerance, numbness, and rhinorrhea.  She has intermitt headache    Objective:   Physical Exam VS: see vs page GEN: no distress HEAD: head: no deformity eyes: no periorbital swelling, no proptosis external nose and ears are normal mouth: no lesion seen NECK: 6 cm right thyroid nodule is easily palpable CHEST WALL: no deformity LUNGS: clear to auscultation CV: reg rate and rhythm, no murmur ABD: abdomen is soft, nontender.  no hepatosplenomegaly.  not distended.  no hernia MUSCULOSKELETAL: muscle bulk and strength are grossly normal.  no obvious joint swelling.  gait is normal and steady EXTEMITIES: no deformity.  no ulcer on the feet.  feet are of normal color and temp.  no edema PULSES: dorsalis pedis intact bilat.  no carotid bruit NEURO:  cn 2-12 grossly  intact.   readily moves all 4's.  sensation is intact to touch on the feet SKIN:  Normal texture and temperature.  No rash or suspicious lesion is visible.   NODES:  None palpable at the neck PSYCH: alert, well-oriented.  Does not appear anxious nor depressed.    Lab Results  Component Value Date   TSH 0.829 01/19/2017   T4TOTAL 5.9 01/19/2017   [place] (2018)1. Thyromegaly with bilateral nodules. 2. Recommend FNA biopsy of 4.9 cm mildly suspicious mid right nodule. 3. Recommend 1 year follow-up ultrasound of smaller right nodule  [place] (2001): SOLID NODULE MEASURING UP TO 3.1 CM. AT THE MID RIGHT LOBE WITH TINY 3 MM. LEFT LOBE NODULE.  THESE PROBABLY REPRESENT ADENOMATOUS CHANGES.  HOWEVER, SIX MONTH FOLLOW UP THYROID ULTRASOUND, NUCLEAR MEDICINE THYROID SCAN, AND/OR NEEDLE ASPIRATION CYTOLOGY OF THE RIGHT THYROID NODULE ARE ADVISED FOR FURTHER CONFIRMATION.  thyroid needle bx: consent obtained, signed form on chart The area is first sprayed with cooling agent local: xylocaine 2%, with epinephrine prep: alcohol pad 4 bxs are done with 25 and 07M needles no complications.  I have reviewed outside records, and summarized:  Pt was noted to have goiter, and referred here.  She was seen for well woman exam, and was in good health.       Assessment & Plan:  Multinodular goiter: new.  Relative stability over many years makes it low-risk.  We discussed, and decided to do bx.   Patient Instructions  We'll let you know about the biopsy results. Please come back for a follow-up appointment in 1 year. most of the time, a "lumpy thyroid" will eventually become overactive.  this is usually a slow process, happening over the span of many years.

## 2017-02-09 ENCOUNTER — Ambulatory Visit
Admission: RE | Admit: 2017-02-09 | Discharge: 2017-02-09 | Disposition: A | Discharge status: Home / Self Care | Payer: 59 | Source: Ambulatory Visit | Attending: Obstetrics and Gynecology | Admitting: Obstetrics and Gynecology

## 2017-02-09 DIAGNOSIS — Z1231 Encounter for screening mammogram for malignant neoplasm of breast: Secondary | ICD-10-CM | POA: Diagnosis not present

## 2017-02-11 ENCOUNTER — Other Ambulatory Visit: Payer: Self-pay | Admitting: Obstetrics and Gynecology

## 2017-02-11 DIAGNOSIS — R928 Other abnormal and inconclusive findings on diagnostic imaging of breast: Secondary | ICD-10-CM

## 2017-02-17 ENCOUNTER — Ambulatory Visit
Admission: RE | Admit: 2017-02-17 | Discharge: 2017-02-17 | Disposition: A | Discharge status: Home / Self Care | Payer: 59 | Source: Ambulatory Visit | Attending: Obstetrics and Gynecology | Admitting: Obstetrics and Gynecology

## 2017-02-17 ENCOUNTER — Ambulatory Visit: Payer: 59

## 2017-02-17 DIAGNOSIS — R922 Inconclusive mammogram: Secondary | ICD-10-CM | POA: Diagnosis not present

## 2017-02-17 DIAGNOSIS — R928 Other abnormal and inconclusive findings on diagnostic imaging of breast: Secondary | ICD-10-CM

## 2017-03-03 ENCOUNTER — Telehealth: Payer: Self-pay | Admitting: *Deleted

## 2017-03-03 NOTE — Telephone Encounter (Signed)
Refill request for Terbinafine. Pt received therapeutic dosing of the terbinafine #90 on 11/26/2016. Return fax denying.

## 2017-03-22 ENCOUNTER — Other Ambulatory Visit: Payer: Self-pay | Admitting: Internal Medicine

## 2017-04-10 ENCOUNTER — Other Ambulatory Visit: Payer: Self-pay | Admitting: Internal Medicine

## 2017-04-27 DIAGNOSIS — Z79899 Other long term (current) drug therapy: Secondary | ICD-10-CM | POA: Diagnosis not present

## 2017-04-27 DIAGNOSIS — I10 Essential (primary) hypertension: Secondary | ICD-10-CM | POA: Diagnosis not present

## 2017-05-22 ENCOUNTER — Other Ambulatory Visit: Payer: Self-pay | Admitting: Internal Medicine

## 2017-06-08 ENCOUNTER — Ambulatory Visit (INDEPENDENT_AMBULATORY_CARE_PROVIDER_SITE_OTHER): Payer: 59 | Admitting: Internal Medicine

## 2017-06-08 ENCOUNTER — Encounter: Payer: Self-pay | Admitting: Internal Medicine

## 2017-06-08 DIAGNOSIS — J040 Acute laryngitis: Secondary | ICD-10-CM

## 2017-06-08 DIAGNOSIS — E559 Vitamin D deficiency, unspecified: Secondary | ICD-10-CM | POA: Diagnosis not present

## 2017-06-08 DIAGNOSIS — I10 Essential (primary) hypertension: Secondary | ICD-10-CM | POA: Diagnosis not present

## 2017-06-08 DIAGNOSIS — R682 Dry mouth, unspecified: Secondary | ICD-10-CM | POA: Diagnosis not present

## 2017-06-08 NOTE — Assessment & Plan Note (Signed)
Use [employer] Peroxicare tooth paste  

## 2017-06-08 NOTE — Assessment & Plan Note (Signed)
Voice rest, Mucinex ENT ref if not worse

## 2017-06-08 NOTE — Assessment & Plan Note (Signed)
Norvasc Avalide

## 2017-06-08 NOTE — Assessment & Plan Note (Signed)
Vit D 

## 2017-06-08 NOTE — Patient Instructions (Addendum)
Mucinex Voice rest Use [employer] Peroxicare tooth paste Lemon in water

## 2017-06-08 NOTE — Progress Notes (Signed)
Subjective:  Patient ID: Whitney Patton, female    DOB: 1965-06-01  Age: 52 y.o. MRN: [id number]  CC: No chief complaint on file.   HPI Whitney Patton presents for HTN, hoarseness x 2 mo - better. F/u onychomycosis - better  Outpatient Medications Prior to Visit  Medication Sig Dispense Refill  . amLODipine (NORVASC) 5 MG tablet TAKE 1 TABLET (5 MG TOTAL) BY MOUTH DAILY. 90 tablet 3  . cholecalciferol (VITAMIN D) 1000 units tablet Take 2 tablets (2,000 Units total) by mouth daily. 100 tablet 3  . ciclopirox (PENLAC) 8 % solution Apply topically at bedtime. Apply over nail and surrounding skin. Apply daily over previous coat. After seven (7) days, may remove with alcohol and continue cycle. 6.6 mL 0  . halobetasol (ULTRAVATE) 0.05 % cream APPLY TOPICALLY 2 (TWO) TIMES DAILY. 50 g 1  . irbesartan-hydrochlorothiazide (AVALIDE) 300-12.5 MG tablet TAKE 1 TABLET BY MOUTH DAILY. 90 tablet 3  . vitamin C (ASCORBIC ACID) 500 MG tablet Take 500 mg by mouth daily.    . Biotin 1000 MCG tablet Take 1,000 mcg by mouth 2 (two) times daily.    . D3-50 50000 units capsule TAKE ONE CAPSULE BY MOUTH ONE TIME PER WEEK (Patient not taking: Reported on 02/04/2017) 8 capsule 0  . Dermatological Products, Misc. (NONYX) GEL Apply topically.    . diclofenac (VOLTAREN) 25 MG EC tablet Take 1 tablet (25 mg total) by mouth 2 (two) times daily. (Patient not taking: Reported on 02/04/2017) 15 tablet 0  . terbinafine (LAMISIL) 250 MG tablet Take 1 tablet (250 mg total) by mouth daily. 90 tablet 0   Facility-Administered Medications Prior to Visit  Medication Dose Route Frequency Provider Last Rate Last Dose  . 0.9 %  sodium chloride infusion  500 mL Intravenous Continuous Irene Shipper, MD        ROS Review of Systems  Constitutional: Negative for activity change, appetite change, chills, fatigue and unexpected weight change.  HENT: Negative for congestion, mouth sores and sinus pressure.   Eyes: Negative for  visual disturbance.  Respiratory: Negative for cough and chest tightness.   Gastrointestinal: Negative for abdominal pain and nausea.  Genitourinary: Negative for difficulty urinating, frequency and vaginal pain.  Musculoskeletal: Negative for back pain and gait problem.  Skin: Negative for pallor and rash.  Neurological: Negative for dizziness, tremors, weakness, numbness and headaches.  Psychiatric/Behavioral: Negative for confusion and sleep disturbance.  hoarse  Objective:  BP 116/72 (BP Location: Left Arm, Patient Position: Sitting, Cuff Size: Large)   Pulse 78   Temp 98 F (36.7 C) (Oral)   Ht 5' 3.75" (1.619 m)   Wt 147 lb (66.7 kg)   SpO2 99%   BMI 25.43 kg/m   BP Readings from Last 3 Encounters:  06/08/17 116/72  02/04/17 128/74  01/19/17 116/72    Wt Readings from Last 3 Encounters:  06/08/17 147 lb (66.7 kg)  02/04/17 143 lb (64.9 kg)  01/19/17 142 lb (64.4 kg)    Physical Exam  Constitutional: She appears well-developed. No distress.  HENT:  Head: Normocephalic.  Right Ear: External ear normal.  Left Ear: External ear normal.  Nose: Nose normal.  Mouth/Throat: Oropharynx is clear and moist.  Eyes: Conjunctivae are normal. Pupils are equal, round, and reactive to light. Right eye exhibits no discharge. Left eye exhibits no discharge.  Neck: Normal range of motion. Neck supple. No JVD present. No tracheal deviation present. No thyromegaly present.  Cardiovascular: Normal rate,  regular rhythm and normal heart sounds.  Pulmonary/Chest: No stridor. No respiratory distress. She has no wheezes.  Abdominal: Soft. Bowel sounds are normal. She exhibits no distension and no mass. There is no tenderness. There is no rebound and no guarding.  Musculoskeletal: She exhibits no edema or tenderness.  Lymphadenopathy:    She has no cervical adenopathy.  Neurological: She displays normal reflexes. No cranial nerve deficit. She exhibits normal muscle tone. Coordination  normal.  Skin: No rash noted. No erythema.  Psychiatric: She has a normal mood and affect. Her behavior is normal. Judgment and thought content normal.  hoarse  Lab Results  Component Value Date   WBC 5.3 12/01/2016   HGB 12.9 12/01/2016   HCT 38.9 12/01/2016   PLT 233.0 12/01/2016   GLUCOSE 93 12/01/2016   CHOL 163 12/26/2015   TRIG 40.0 12/26/2015   HDL 73.40 12/26/2015   LDLCALC 81 12/26/2015   ALT 11 12/26/2015   AST 15 12/26/2015   NA 136 12/01/2016   K 4.0 12/01/2016   CL 102 12/01/2016   CREATININE 0.72 12/01/2016   BUN 12 12/01/2016   CO2 30 12/01/2016   TSH 0.829 01/19/2017    Mm Diag Breast Tomo Uni Right  Result Date: 02/17/2017 CLINICAL DATA:  Patient presents for additional views of the right breast as followup to a recent screening exam to evaluate a possible asymmetry over the upper breast on the MLO view. EXAM: 2D DIGITAL DIAGNOSTIC UNILATERAL RIGHT MAMMOGRAM WITH CAD AND ADJUNCT TOMO COMPARISON:  Previous exam(s). ACR Breast Density Category c: The breast tissue is heterogeneously dense, which may obscure small masses. FINDINGS: CC and MLO tomographic images demonstrate no focal abnormality over the upper right breast. Remainder of the exam is unchanged. Mammographic images were processed with CAD. IMPRESSION: No focal abnormality over the upper right breast. RECOMMENDATION: Recommend continued annual bilateral screening mammographic followup. I have discussed the findings and recommendations with the patient. Results were also provided in writing at the conclusion of the visit. If applicable, a reminder letter will be sent to the patient regarding the next appointment. BI-RADS CATEGORY  1: Negative. Electronically Signed   By: Marin Olp M.D.   On: 02/17/2017 16:26    Assessment & Plan:   There are no diagnoses linked to this encounter. I have discontinued Salomon Mast. Clevenger's Biotin, NONYX, terbinafine, diclofenac, and D3-50. I am also having her maintain her  ciclopirox, cholecalciferol, irbesartan-hydrochlorothiazide, halobetasol, amLODipine, and vitamin C. We will continue to administer sodium chloride.  No orders of the defined types were placed in this encounter.    Follow-up: No Follow-up on file.  Walker Kehr, MD

## 2017-06-17 ENCOUNTER — Telehealth: Payer: Self-pay | Admitting: Internal Medicine

## 2017-06-17 DIAGNOSIS — J040 Acute laryngitis: Secondary | ICD-10-CM

## 2017-06-17 NOTE — Telephone Encounter (Signed)
Copied from [place] [id number]. Topic: Referral - Request >> Jun 17, 2017  1:37 PM Pricilla Handler wrote: Reason for CRM: Patient called wanting a referral to a ENT Doctor. Patient states Dr. Mamie Nick requested for her to call back if she decided she want the referral. Patient now wants the referral. Patient wants Dr. Lucy Antigua  CMA to call her back today also. Patient refused to talk with a [place].       Thank You!!!

## 2017-06-18 NOTE — Telephone Encounter (Signed)
Ok thx.

## 2017-06-20 NOTE — Telephone Encounter (Signed)
Called pt no answer LMOM MD has place referral to see ENT once appt has been made will receive call w/appt info.Marland KitchenJohny Patton

## 2017-06-22 ENCOUNTER — Telehealth: Payer: Self-pay

## 2017-06-22 NOTE — Telephone Encounter (Signed)
Patient states for the last month and a half when she wakes up both of her hands a numb. She doesn't know if it is the way she is sleeping or what could be causing this but would like your opinion. Please advise  Copied from [place] [id number]. Topic: Inquiry >> Jun 21, 2017 11:12 AM Ahmed Prima L wrote: Patient only wants to speak to the nurse. Would not give me any further info. Call [phone]

## 2017-06-23 NOTE — Telephone Encounter (Signed)
It may be carpal tunnel. I would see Dr Jaquita Rector

## 2017-06-23 NOTE — Telephone Encounter (Signed)
Pt notified and scheduling an appt with dr Tamala Julian

## 2017-06-29 ENCOUNTER — Encounter: Payer: Self-pay | Admitting: Family Medicine

## 2017-06-29 ENCOUNTER — Ambulatory Visit (INDEPENDENT_AMBULATORY_CARE_PROVIDER_SITE_OTHER): Payer: 59 | Admitting: Family Medicine

## 2017-06-29 DIAGNOSIS — G5603 Carpal tunnel syndrome, bilateral upper limbs: Secondary | ICD-10-CM | POA: Diagnosis not present

## 2017-06-29 MED ORDER — GABAPENTIN 100 MG PO CAPS: 200.0000 mg | ORAL_CAPSULE | Freq: Every day | ORAL | 3 refills | Status: DC

## 2017-06-29 NOTE — Assessment & Plan Note (Signed)
.  Carpal Tunnel Syndrome: We discussed the anatomy involved, and that carpal tunnel syndrome primarily involves the median nerve, and this typically affects digits one through 3. We also discussed that mild cases of carpal tunnel syndrome are often improved with night splints, and it is very reasonable to consider a carpal tunnel injection. If the patient does have moderate to severe carpal tunnel syndrome based on NCV, then it is certainly reasonable to consider carpal tunnel release, which was discussed with the patient. We also discussed his severe carpal tunnel syndrome can lead to permanent nerve impairment even if released. At this point, the patient like to proceed conservatively. Patient will return to clinic in 4 weeks

## 2017-06-29 NOTE — Patient Instructions (Signed)
Very nice to meet you  Ice 20 minutes 2 times a day  Wear brace at night.  Consider getting another one over the counter for left but likely not needed Pennsaid topical pinkie amount 2 times a day  Gabapentin 200mg  at night if really giving you trouble.  Exercises 3 times a week  See me again in 4 weeks

## 2017-06-29 NOTE — Progress Notes (Signed)
[facility] [address], [place] [address] Phone: [phone] Subjective:    I'm seeing this patient by the request  of:  Plotnikov, Evie Lacks, MD   CC: Bilateral wrist pain  [id number]  Whitney Patton is a 52 y.o. female coming in with complaint of hand numbness. She has been waking up with numbness in her hands. 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 She has been having this tingling for one month.  Patient states that her minutes seems to get better.  Concerning the weakness except in the overnight.  No pain with that at this point but is concerning if this did last longer throughout the day.     Past Medical History:  Diagnosis Date  . Allergy   . Hypertension    No past surgical history on file. Social History   Socioeconomic History  . Marital status: Married    Spouse name: None  . Number of children: None  . Years of education: None  . Highest education level: None  Social Needs  . Financial resource strain: None  . Food insecurity - worry: None  . Food insecurity - inability: None  . Transportation needs - medical: None  . Transportation needs - non-medical: None  Occupational History  . None  Tobacco Use  . Smoking status: Current Every Day Smoker    Packs/day: 0.25    Types: Cigarettes  . Smokeless tobacco: Never Used  Substance and Sexual Activity  . Alcohol use: Yes    Alcohol/week: 4.2 - 8.4 oz    Types: 7 - 14 Glasses of wine per week  . Drug use: No  . Sexual activity: Yes    Partners: Male    Birth control/protection: None  Other Topics Concern  . None  Social History Narrative  . None   No Known Allergies Family History  Problem Relation Age of Onset  . Lung cancer Mother   . Diabetes Father   . Diabetes Brother   . Colon cancer Neg Hx   . Colon polyps Neg Hx   . Esophageal cancer Neg Hx   . Rectal cancer Neg Hx   . Stomach cancer Neg Hx   . Thyroid disease Neg Hx       Past medical history, social, surgical and family history all reviewed in electronic medical record.  No pertanent information unless stated regarding to the chief complaint.   Review of Systems:Review of systems updated and as accurate as of 06/29/17  No headache, visual changes, nausea, vomiting, diarrhea, constipation, dizziness, abdominal pain, skin rash, fevers, chills, night sweats, weight loss, swollen lymph nodes, body aches, joint swelling, muscle aches, chest pain, shortness of breath, mood changes.   Objective  Blood pressure 122/82, pulse 87, height 5' 3.75" (1.619 m), weight 145 lb (65.8 kg), SpO2 98 %. Systems examined below as of 06/29/17   General: No apparent distress alert and oriented x3 mood and affect normal, dressed appropriately.  HEENT: Pupils equal, extraocular movements intact  Respiratory: Patient's speak in full sentences and does not appear short of breath  Cardiovascular: No lower extremity edema, non tender, no erythema  Skin: Warm dry intact with no signs of infection or rash on extremities or on axial skeleton.  Abdomen: Soft nontender  Neuro: Cranial nerves II through XII are intact, neurovascularly intact in all extremities with 2+ DTRs and 2+ pulses.  Lymph: No lymphadenopathy of posterior or anterior cervical chain  or axillae bilaterally.  Gait normal with good balance and coordination.  MSK:  Non tender with full range of motion and good stability and symmetric strength and tone of shoulders, elbows, hip, knee and ankles bilaterally.   Wrist: Bilateral Inspection normal with no visible erythema or swelling. ROM smooth and normal with good flexion and extension and ulnar/radial deviation that is symmetrical with opposite wrist. Palpation is normal over metacarpals, navicular, lunate, and TFCC; tendons without tenderness/ swelling No snuffbox tenderness. No tenderness over Canal of [place]. Strength 5/5 in all directions without pain. Negative  Finkelstein, positive Tinel's and phalens. Negative Watson's test.  MSK US performed of: Bilateral wrist This study was ordered, performed, and interpreted by Charlann Boxer D.O.  Wrist: Limited ultrasound shows the patient does have dilatation of the median nerve on the right side of 0.15 cm in area.  0.09 cm on the contralateral side.  IMPRESSION: Right-sided carpal tunnel    Impression and Recommendations:     This case required medical decision making of moderate complexity.      Note: This dictation was prepared with Dragon dictation along with smaller phrase technology. Any transcriptional errors that result from this process are unintentional.

## 2017-07-20 DIAGNOSIS — J342 Deviated nasal septum: Secondary | ICD-10-CM | POA: Diagnosis not present

## 2017-07-20 DIAGNOSIS — J343 Hypertrophy of nasal turbinates: Secondary | ICD-10-CM | POA: Diagnosis not present

## 2017-07-20 DIAGNOSIS — J310 Chronic rhinitis: Secondary | ICD-10-CM | POA: Diagnosis not present

## 2017-07-25 ENCOUNTER — Other Ambulatory Visit: Payer: Self-pay | Admitting: *Deleted

## 2017-07-25 MED ORDER — GABAPENTIN 100 MG PO CAPS: 200.0000 mg | ORAL_CAPSULE | Freq: Every day | ORAL | 1 refills | Status: DC

## 2017-07-27 ENCOUNTER — Encounter: Payer: Self-pay | Admitting: Family Medicine

## 2017-07-27 ENCOUNTER — Ambulatory Visit (INDEPENDENT_AMBULATORY_CARE_PROVIDER_SITE_OTHER): Payer: 59 | Admitting: Family Medicine

## 2017-07-27 DIAGNOSIS — G5603 Carpal tunnel syndrome, bilateral upper limbs: Secondary | ICD-10-CM

## 2017-07-27 NOTE — Patient Instructions (Signed)
Good to see you  Whitney Patton is your friend When acting up do the exercises wear the braces and gabapentin  As long as you do well give me a call when you need me [phone]

## 2017-07-27 NOTE — Progress Notes (Signed)
[facility] [address], [place] [address] Phone: [phone] Subjective:    I'm seeing this patient by the request  of:    CC: Wrist pain follow-up  [id number]  Whitney Patton is a 52 y.o. female coming in with complaint of bilateral wrist pain.  Found to have carpal tunnel bilaterally.  Worsening symptoms recently.  Patient though states that she takes medication regularly she seems to do well.  Does not affect her during the day and only at night.  If she wears the braces at night it seems to be improving.  Feels that overall though she was age a 75% better and only worsening the last couple days.     Past Medical History:  Diagnosis Date  . Allergy   . Hypertension    No past surgical history on file. Social History   Socioeconomic History  . Marital status: Married    Spouse name: Not on file  . Number of children: Not on file  . Years of education: Not on file  . Highest education level: Not on file  Occupational History  . Not on file  Social Needs  . Financial resource strain: Not on file  . Food insecurity:    Worry: Not on file    Inability: Not on file  . Transportation needs:    Medical: Not on file    Non-medical: Not on file  Tobacco Use  . Smoking status: Current Every Day Smoker    Packs/day: 0.25    Types: Cigarettes  . Smokeless tobacco: Never Used  Substance and Sexual Activity  . Alcohol use: Yes    Alcohol/week: 4.2 - 8.4 oz    Types: 7 - 14 Glasses of wine per week  . Drug use: No  . Sexual activity: Yes    Partners: Male    Birth control/protection: None  Lifestyle  . Physical activity:    Days per week: Not on file    Minutes per session: Not on file  . Stress: Not on file  Relationships  . Social connections:    Talks on phone: Not on file    Gets together: Not on file    Attends religious service: Not on file    Active member of club or organization: Not on file    Attends meetings of  clubs or organizations: Not on file    Relationship status: Not on file  Other Topics Concern  . Not on file  Social History Narrative  . Not on file   No Known Allergies Family History  Problem Relation Age of Onset  . Lung cancer Mother   . Diabetes Father   . Diabetes Brother   . Colon cancer Neg Hx   . Colon polyps Neg Hx   . Esophageal cancer Neg Hx   . Rectal cancer Neg Hx   . Stomach cancer Neg Hx   . Thyroid disease Neg Hx      Past medical history, social, surgical and family history all reviewed in electronic medical record.  No pertanent information unless stated regarding to the chief complaint.   Review of Systems:Review of systems updated and as accurate as of 07/27/17  No headache, visual changes, nausea, vomiting, diarrhea, constipation, dizziness, abdominal pain, skin rash, fevers, chills, night sweats, weight loss, swollen lymph nodes, body aches, joint swelling, muscle aches, chest pain, shortness of breath, mood changes.   Objective  Blood pressure 120/84, pulse 89, height 5\' 3"  (  1.6 m), weight 144 lb (65.3 kg), SpO2 98 %. Systems examined below as of 07/27/17   General: No apparent distress alert and oriented x3 mood and affect normal, dressed appropriately.  HEENT: Pupils equal, extraocular movements intact  Respiratory: Patient's speak in full sentences and does not appear short of breath  Cardiovascular: No lower extremity edema, non tender, no erythema  Skin: Warm dry intact with no signs of infection or rash on extremities or on axial skeleton.  Abdomen: Soft nontender  Neuro: Cranial nerves II through XII are intact, neurovascularly intact in all extremities with 2+ DTRs and 2+ pulses.  Lymph: No lymphadenopathy of posterior or anterior cervical chain or axillae bilaterally.  Gait normal with good balance and coordination.  MSK:  Non tender with full range of motion and good stability and symmetric strength and tone of shoulders, elbows, hip, knee  and ankles bilaterally.  Bilateral wrist exam still has some mild positive Tinel's but nothing no severe.  Full range of motion and full strength.    Impression and Recommendations:     This case required medical decision making of moderate complexity.      Note: This dictation was prepared with Dragon dictation along with smaller phrase technology. Any transcriptional errors that result from this process are unintentional.

## 2017-07-27 NOTE — Assessment & Plan Note (Signed)
Patient has made some improvement overall with physical exam as well as patient's history with the carpal tunnel.  Encourage patient to take the gabapentin on a regular basis which she said she likely will not do.  We discussed injections and surgery or other options with the severity noted on the nerve conduction study.  Patient will follow-up as needed

## 2017-11-11 DIAGNOSIS — I10 Essential (primary) hypertension: Secondary | ICD-10-CM | POA: Diagnosis not present

## 2017-11-11 DIAGNOSIS — Z0000 Encounter for general adult medical examination without abnormal findings: Secondary | ICD-10-CM | POA: Diagnosis not present

## 2017-11-14 ENCOUNTER — Other Ambulatory Visit (HOSPITAL_COMMUNITY): Payer: Self-pay | Admitting: Geriatric Medicine

## 2017-11-14 DIAGNOSIS — R9431 Abnormal electrocardiogram [ECG] [EKG]: Secondary | ICD-10-CM

## 2017-11-18 ENCOUNTER — Encounter (INDEPENDENT_AMBULATORY_CARE_PROVIDER_SITE_OTHER): Payer: Self-pay

## 2017-11-18 ENCOUNTER — Ambulatory Visit (HOSPITAL_COMMUNITY): Payer: 59 | Attending: Cardiovascular Disease

## 2017-11-18 ENCOUNTER — Other Ambulatory Visit: Payer: Self-pay

## 2017-11-18 DIAGNOSIS — R9431 Abnormal electrocardiogram [ECG] [EKG]: Secondary | ICD-10-CM | POA: Diagnosis not present

## 2017-11-18 DIAGNOSIS — I119 Hypertensive heart disease without heart failure: Secondary | ICD-10-CM | POA: Diagnosis not present

## 2017-12-07 ENCOUNTER — Ambulatory Visit: Payer: 59 | Admitting: Internal Medicine

## 2018-01-18 ENCOUNTER — Ambulatory Visit: Payer: 59 | Admitting: Internal Medicine

## 2018-01-25 ENCOUNTER — Ambulatory Visit: Payer: 59 | Admitting: Obstetrics and Gynecology

## 2018-02-03 ENCOUNTER — Encounter: Payer: Self-pay | Admitting: Endocrinology

## 2018-02-03 ENCOUNTER — Ambulatory Visit (INDEPENDENT_AMBULATORY_CARE_PROVIDER_SITE_OTHER): Payer: 59 | Admitting: Endocrinology

## 2018-02-03 VITALS — BP 124/70 | HR 73 | Ht 63.0 in | Wt 147.8 lb

## 2018-02-03 DIAGNOSIS — E042 Nontoxic multinodular goiter: Secondary | ICD-10-CM

## 2018-02-03 LAB — T4, FREE: Free T4: 0.69 ng/dL (ref 0.60–1.60)

## 2018-02-03 LAB — TSH: TSH: 0.72 u[IU]/mL (ref 0.35–4.50)

## 2018-02-03 NOTE — Progress Notes (Signed)
Subjective:    Patient ID: Whitney Patton, female    DOB: 1966-03-03, 52 y.o.   MRN: [id number]  HPI Pt returns for f/u of Multinodular goiter (dx'ed 2001; [place] in 2018 showed: recommend FNA mid right nodule: 4.9x2.1 x 2.8 cm, compared to 3.1 x 1.4 x 1.8 cm (2001); bx (2018): showed BENIGN FOLLICULAR NODULE (BETHESDA CATEGORY II)).  pt states she feels well in general.  She does not notice the goiter.   Past Medical History:  Diagnosis Date  . Allergy   . Hypertension     No past surgical history on file.  Social History   Socioeconomic History  . Marital status: Married    Spouse name: Not on file  . Number of children: Not on file  . Years of education: Not on file  . Highest education level: Not on file  Occupational History  . Not on file  Social Needs  . Financial resource strain: Not on file  . Food insecurity:    Worry: Not on file    Inability: Not on file  . Transportation needs:    Medical: Not on file    Non-medical: Not on file  Tobacco Use  . Smoking status: Current Every Day Smoker    Packs/day: 0.25    Types: Cigarettes  . Smokeless tobacco: Never Used  Substance and Sexual Activity  . Alcohol use: Yes    Alcohol/week: 7.0 - 14.0 standard drinks    Types: 7 - 14 Glasses of wine per week  . Drug use: No  . Sexual activity: Yes    Partners: Male    Birth control/protection: None  Lifestyle  . Physical activity:    Days per week: Not on file    Minutes per session: Not on file  . Stress: Not on file  Relationships  . Social connections:    Talks on phone: Not on file    Gets together: Not on file    Attends religious service: Not on file    Active member of club or organization: Not on file    Attends meetings of clubs or organizations: Not on file    Relationship status: Not on file  . Intimate partner violence:    Fear of current or ex partner: Not on file    Emotionally abused: Not on file    Physically abused: Not on file    Forced sexual  activity: Not on file  Other Topics Concern  . Not on file  Social History Narrative  . Not on file    Current Outpatient Medications on File Prior to Visit  Medication Sig Dispense Refill  . amLODipine (NORVASC) 5 MG tablet TAKE 1 TABLET (5 MG TOTAL) BY MOUTH DAILY. 90 tablet 3  . ciclopirox (PENLAC) 8 % solution Apply topically at bedtime. Apply over nail and surrounding skin. Apply daily over previous coat. After seven (7) days, may remove with alcohol and continue cycle. 6.6 mL 0  . gabapentin (NEURONTIN) 100 MG capsule Take 2 capsules (200 mg total) by mouth at bedtime. 180 capsule 1  . halobetasol (ULTRAVATE) 0.05 % cream APPLY TOPICALLY 2 (TWO) TIMES DAILY. 50 g 1  . irbesartan-hydrochlorothiazide (AVALIDE) 300-12.5 MG tablet TAKE 1 TABLET BY MOUTH DAILY. 90 tablet 3  . vitamin C (ASCORBIC ACID) 500 MG tablet Take 500 mg by mouth daily.     Current Facility-Administered Medications on File Prior to Visit  Medication Dose Route Frequency Provider Last Rate Last Dose  . 0.9 %  sodium chloride infusion  500 mL Intravenous Continuous Irene Shipper, MD        No Known Allergies  Family History  Problem Relation Age of Onset  . Lung cancer Mother   . Diabetes Father   . Diabetes Brother   . Colon cancer Neg Hx   . Colon polyps Neg Hx   . Esophageal cancer Neg Hx   . Rectal cancer Neg Hx   . Stomach cancer Neg Hx   . Thyroid disease Neg Hx     BP 124/70 (BP Location: Left Arm, Patient Position: Sitting, Cuff Size: Normal)   Pulse 73   Ht 5\' 3"  (1.6 m)   Wt 147 lb 12.8 oz (67 kg)   SpO2 97%   BMI 26.18 kg/m   Review of Systems Denies neck pain    Objective:   Physical Exam VITAL SIGNS:  See vs page GENERAL: no distress NECK: right thyroid mass is again noted.    Lab Results  Component Value Date   TSH 0.72 02/03/2018   T4TOTAL 5.9 01/19/2017      Assessment & Plan:  Multinodular goiter, little changed in size over the period of many years, but she is at risk  for hyperthyroidism.   Patient Instructions  A thyroid blood test is requested for you today.  We'll let you know about the results. Please come back for a follow-up appointment in 1 year. most of the time, a "lumpy thyroid" will eventually become overactive.  this is usually a slow process, happening over the span of many years.

## 2018-02-03 NOTE — Patient Instructions (Addendum)
A thyroid blood test is requested for you today.  We'll let you know about the results. Please come back for a follow-up appointment in 1 year. most of the time, a "lumpy thyroid" will eventually become overactive.  this is usually a slow process, happening over the span of many years.

## 2018-02-07 ENCOUNTER — Telehealth: Payer: Self-pay

## 2018-02-07 NOTE — Telephone Encounter (Signed)
Error

## 2018-02-07 NOTE — Telephone Encounter (Signed)
Called pt to review results of labs. LVM requesting returned call.

## 2018-02-07 NOTE — Telephone Encounter (Signed)
-----   Message from Renato Shin, MD sent at 02/03/2018  6:25 PM EDT ----- please call patient: Normal--good

## 2018-02-07 NOTE — Progress Notes (Signed)
52 y.o. G29P1011 Married Black or [place] American [name] here for annual exam.  She was diagnosed with a multinodular goiter last year at her annual exam and is being followed by Dr Loanne Drilling.  No bleeding for 6 months, prior to that her cycles were irregular. She c/o hot flashes, worse at night. Some mild night sweats. Overall sleeping okay. Tolerable. No vaginal dryness or dyspareunia. No bowel or bladder c/o.     Patient's last menstrual period was 07/18/2017 (approximate).          Sexually active: Yes.    The current method of family planning is none.  Exercising: No.  The patient does not participate in regular exercise at present. Smoker:  Yes, 1 pack a week. She would like to quit.   Health Maintenance: Pap:  01/14/16 Pap and HR HPV negative History of abnormal Pap:  Yes, years ago MMG:  02/09/2017 bilateral screening Birads 0, right breast diagnostic performed on 02/17/2017 Birads 1 negative Colonoscopy:  05/07/16 Polyp removed -- normal repeat 5 years BMD:   Never TDaP:  12/24/15 Gardasil: N/a   reports that she has been smoking cigarettes. She has been smoking about 0.25 packs per day. She has never used smokeless tobacco. She reports that she drinks about 7.0 standard drinks of alcohol per week. She reports that she does not use drugs. She works as a Probation officer. 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   Past Medical History:  Diagnosis Date  . Allergy   . Hypertension   . Multinodular goiter     History reviewed. No pertinent surgical history.  Current Outpatient Medications  Medication Sig Dispense Refill  . amLODipine (NORVASC) 5 MG tablet TAKE 1 TABLET (5 MG TOTAL) BY MOUTH DAILY. 90 tablet 3  . halobetasol (ULTRAVATE) 0.05 % cream APPLY TOPICALLY 2 (TWO) TIMES DAILY. 50 g 1  . irbesartan-hydrochlorothiazide (AVALIDE) 300-12.5 MG tablet TAKE 1 TABLET BY MOUTH DAILY. 90 tablet 3  . vitamin C (ASCORBIC ACID) 500 MG tablet Take 500 mg by mouth daily.      Current Facility-Administered Medications  Medication Dose Route Frequency Provider Last Rate Last Dose  . 0.9 %  sodium chloride infusion  500 mL Intravenous Continuous Irene Shipper, MD        Family History  Problem Relation Age of Onset  . Lung cancer Mother   . Diabetes Father   . Diabetes Brother   . Colon cancer Neg Hx   . Colon polyps Neg Hx   . Esophageal cancer Neg Hx   . Rectal cancer Neg Hx   . Stomach cancer Neg Hx   . Thyroid disease Neg Hx     Review of Systems  Constitutional: Negative.   HENT: Negative.   Eyes: Negative.   Respiratory: Negative.   Cardiovascular: Negative.   Gastrointestinal: Negative.   Endocrine: Negative.   Genitourinary: Negative.   Musculoskeletal: Negative.   Skin: Negative.   Allergic/Immunologic: Negative.   Neurological: Negative.   Hematological: Negative.   Psychiatric/Behavioral: Negative.     Exam:   BP 128/88 (BP Location: Right Arm, Patient Position: Sitting, Cuff Size: Normal)   Pulse 64   Ht 5\' 3"  (1.6 m)   Wt 147 lb 6.4 oz (66.9 kg)   LMP 07/18/2017 (Approximate)   BMI 26.11 kg/m   Weight change: @WEIGHTCHANGE @ Height:   Height: 5\' 3"  (160 cm)  Ht Readings from Last 3 Encounters:  02/08/18 5\' 3"  (1.6 m)  02/03/18 5'  3" (1.6 m)  07/27/17 5\' 3"  (1.6 m)    General appearance: alert, cooperative and appears stated age Head: Normocephalic, without obvious abnormality, atraumatic Neck: no adenopathy, supple, symmetrical, trachea midline and thyroid right lobe>left lobe Lungs: clear to auscultation bilaterally Cardiovascular: regular rate and rhythm Breasts: normal appearance, no masses or tenderness Abdomen: soft, non-tender; non distended,  no masses,  no organomegaly Extremities: extremities normal, atraumatic, no cyanosis or edema Skin: Skin color, texture, turgor normal. No rashes or lesions Lymph nodes: Cervical, supraclavicular, and axillary nodes normal. No abnormal inguinal nodes palpated Neurologic:  Grossly normal   Pelvic: External genitalia:  no lesions              Urethra:  normal appearing urethra with no masses, tenderness or lesions              Bartholins and Skenes: normal                 Vagina: mildly atrophic appearing vagina with normal color and discharge, no lesions              Cervix: no lesions               Bimanual Exam:  Uterus:  normal size, contour, position, consistency, mobility, non-tender              Adnexa: no mass, fullness, tenderness               Rectovaginal: Confirms               Anus:  normal sphincter tone, no lesions  Chaperone was present for exam.  A:  Well Woman with normal exam  Smoker, plans to quit  HTN managed by primary  Perimenopausal, no bleeding for >6 months, tolerable vasomotor symptoms  Multinodular goiter, followed by Endocrinolgy  P:   Pap next year  Call with bleeding or worsening vasomotor symptoms  Mammogram due  Colonoscopy UTD  Discussed breast self exam  Discussed calcium and vit D intake  Encouraged to quit smoking '

## 2018-02-07 NOTE — Telephone Encounter (Signed)
Patient is returning call and requesting a call back.  °

## 2018-02-07 NOTE — Telephone Encounter (Signed)
Returned pt call. Made aware of lab results. Verbalized acceptance and understanding.

## 2018-02-08 ENCOUNTER — Ambulatory Visit (INDEPENDENT_AMBULATORY_CARE_PROVIDER_SITE_OTHER): Payer: 59 | Admitting: Obstetrics and Gynecology

## 2018-02-08 ENCOUNTER — Encounter: Payer: Self-pay | Admitting: Obstetrics and Gynecology

## 2018-02-08 ENCOUNTER — Other Ambulatory Visit: Payer: Self-pay

## 2018-02-08 VITALS — BP 128/88 | HR 64 | Ht 63.0 in | Wt 147.4 lb

## 2018-02-08 DIAGNOSIS — Z01419 Encounter for gynecological examination (general) (routine) without abnormal findings: Secondary | ICD-10-CM

## 2018-02-08 DIAGNOSIS — F17200 Nicotine dependence, unspecified, uncomplicated: Secondary | ICD-10-CM | POA: Diagnosis not present

## 2018-02-08 NOTE — Patient Instructions (Signed)
EXERCISE AND DIET:  We recommended that you start or continue a regular exercise program for good health. Regular exercise means any activity that makes your heart beat faster and makes you sweat.  We recommend exercising at least 30 minutes per day at least 3 days a week, preferably 4 or 5.  We also recommend a diet low in fat and sugar.  Inactivity, poor dietary choices and obesity can cause diabetes, heart attack, stroke, and kidney damage, among others.    ALCOHOL AND SMOKING:  Women should limit their alcohol intake to no more than 7 drinks/beers/glasses of wine (combined, not each!) per week. Moderation of alcohol intake to this level decreases your risk of breast cancer and liver damage. And of course, no recreational drugs are part of a healthy lifestyle.  And absolutely no smoking or even second hand smoke. Most people know smoking can cause heart and lung diseases, but did you know it also contributes to weakening of your bones? Aging of your skin?  Yellowing of your teeth and nails?  CALCIUM AND VITAMIN D:  Adequate intake of calcium and Vitamin D are recommended.  The recommendations for exact amounts of these supplements seem to change often, but generally speaking 1,200 mg of calcium in your diet and supplements(either carbonate or citrate) and 800 units of Vitamin D per day seems prudent. Certain women may benefit from higher intake of Vitamin D.  If you are among these women, your doctor will have told you during your visit.    PAP SMEARS:  Pap smears, to check for cervical cancer or precancers,  have traditionally been done yearly, although recent scientific advances have shown that most women can have pap smears less often.  However, every woman still should have a physical exam from her gynecologist every year. It will include a breast check, inspection of the vulva and vagina to check for abnormal growths or skin changes, a visual exam of the cervix, and then an exam to evaluate the size  and shape of the uterus and ovaries.  And after 53 years of age, a rectal exam is indicated to check for rectal cancers. We will also provide age appropriate advice regarding health maintenance, like when you should have certain vaccines, screening for sexually transmitted diseases, bone density testing, colonoscopy, mammograms, etc.   MAMMOGRAMS:  All women over 31 years old should have a yearly mammogram. Many facilities now offer a "3D" mammogram, which may cost around $50 extra out of pocket. If possible,  we recommend you accept the option to have the 3D mammogram performed.  It both reduces the number of women who will be called back for extra views which then turn out to be normal, and it is better than the routine mammogram at detecting truly abnormal areas.    COLONOSCOPY:  Colonoscopy to screen for colon cancer is recommended for all women at age 41.  We know, you hate the idea of the prep.  We agree, BUT, having colon cancer and not knowing it is worse!!  Colon cancer so often starts as a polyp that can be seen and removed at colonscopy, which can quite literally save your life!  And if your first colonoscopy is normal and you have no family history of colon cancer, most women don't have to have it again for 10 years.  Once every ten years, you can do something that may end up saving your life, right?  We will be happy to help you get it scheduled  when you are ready.  Be sure to check your insurance coverage so you understand how much it will cost.  It may be covered as a preventative service at no cost, but you should check your particular policy.     If your hot flashes get bad, you can try Estroven or Estroven pm for sleep

## 2018-02-17 ENCOUNTER — Other Ambulatory Visit: Payer: Self-pay | Admitting: Obstetrics and Gynecology

## 2018-02-17 ENCOUNTER — Telehealth: Payer: Self-pay | Admitting: Endocrinology

## 2018-02-17 DIAGNOSIS — Z1231 Encounter for screening mammogram for malignant neoplasm of breast: Secondary | ICD-10-CM

## 2018-02-17 NOTE — Telephone Encounter (Signed)
Patient called stating she recieved a phone call about a Scan at [facility] and is confused on why. States Dr. Loanne Drilling said everything looked good the last time she was here. Please call patient.

## 2018-02-20 NOTE — Telephone Encounter (Signed)
It appears this pt was scheduled on 02/17/18 for a screening mammogram to be completed on 04/02/18. Pt is not scheduled for any other imaging at this time. Called pt to inquire further about her question. LVM requesting returned call.

## 2018-02-21 ENCOUNTER — Telehealth: Payer: Self-pay | Admitting: Endocrinology

## 2018-02-21 NOTE — Telephone Encounter (Signed)
please call patient: It is up to her if she wants to have it done.

## 2018-02-21 NOTE — Telephone Encounter (Signed)
Called pt and informed her of Dr. Ellison's response. Verbalized acceptance and understanding. 

## 2018-02-21 NOTE — Telephone Encounter (Signed)
-----   Message from Aleatha Borer, LPN sent at 38/05/5051 11:28 AM EST ----- Regarding: RE: [place] They have already reached out to her to schedule. She declined to schedule because she thought this was in error. She was calling us for clarification. Do you want her to proceed with scheduling?  Thanks, Ammie ----- Message ----- From: Renato Shin, MD Sent: 02/21/2018  11:16 AM EST To: Aleatha Borer, LPN Subject: RE: [place]                                         [place] was ordered on 02/03/18.  Please ask [facility] about progress of the request.     ----- Message ----- From: Aleatha Borer, LPN Sent: 97/09/7339  11:04 AM EST To: Renato Shin, MD Subject: [place]                                             Pt called stating she received a call to have an [place] of her thyroid completed. You saw her on 02/03/18 and according to her, informed her that "everything looks good", "nodule has not changed". Lab results received and documented "Normal". I do see an order for the [place] of her thyroid. She would like to know if it is necessary to have this [place] completed. Please advise.  Thanks, Ammie

## 2018-03-10 ENCOUNTER — Ambulatory Visit (INDEPENDENT_AMBULATORY_CARE_PROVIDER_SITE_OTHER): Payer: 59 | Admitting: Podiatry

## 2018-03-10 ENCOUNTER — Encounter: Payer: Self-pay | Admitting: Podiatry

## 2018-03-10 DIAGNOSIS — L84 Corns and callosities: Secondary | ICD-10-CM | POA: Diagnosis not present

## 2018-03-10 DIAGNOSIS — B351 Tinea unguium: Secondary | ICD-10-CM | POA: Diagnosis not present

## 2018-03-10 MED ORDER — TERBINAFINE HCL 250 MG PO TABS: ORAL_TABLET | ORAL | 0 refills | Status: DC

## 2018-03-12 NOTE — Progress Notes (Signed)
Subjective:   Patient ID: Whitney Patton, female   DOB: 52 y.o.   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  Conditions reviewed and recommended the consideration for oral antifungal or topical but she is not interested in this and allowed to regrow and if it should give her trouble we will consider other treatment plan

## 2018-03-26 ENCOUNTER — Other Ambulatory Visit: Payer: Self-pay | Admitting: Internal Medicine

## 2018-03-31 ENCOUNTER — Ambulatory Visit
Admission: RE | Admit: 2018-03-31 | Discharge: 2018-03-31 | Disposition: A | Discharge status: Home / Self Care | Payer: 59 | Source: Ambulatory Visit | Attending: Obstetrics and Gynecology | Admitting: Obstetrics and Gynecology

## 2018-03-31 DIAGNOSIS — Z1231 Encounter for screening mammogram for malignant neoplasm of breast: Secondary | ICD-10-CM

## 2018-05-12 DIAGNOSIS — Z0000 Encounter for general adult medical examination without abnormal findings: Secondary | ICD-10-CM | POA: Diagnosis not present

## 2018-05-12 DIAGNOSIS — I10 Essential (primary) hypertension: Secondary | ICD-10-CM | POA: Diagnosis not present

## 2018-05-19 ENCOUNTER — Other Ambulatory Visit: Payer: Self-pay | Admitting: Internal Medicine

## 2018-05-30 DIAGNOSIS — R739 Hyperglycemia, unspecified: Secondary | ICD-10-CM | POA: Diagnosis not present

## 2018-05-30 DIAGNOSIS — Z79899 Other long term (current) drug therapy: Secondary | ICD-10-CM | POA: Diagnosis not present

## 2018-06-28 DIAGNOSIS — H35033 Hypertensive retinopathy, bilateral: Secondary | ICD-10-CM | POA: Diagnosis not present

## 2018-07-03 ENCOUNTER — Other Ambulatory Visit: Payer: Self-pay | Admitting: Internal Medicine

## 2018-08-10 ENCOUNTER — Other Ambulatory Visit: Payer: Self-pay | Admitting: Podiatry

## 2018-09-08 ENCOUNTER — Other Ambulatory Visit: Payer: Self-pay | Admitting: Podiatry

## 2018-09-12 ENCOUNTER — Telehealth: Payer: Self-pay | Admitting: *Deleted

## 2018-09-12 NOTE — Telephone Encounter (Signed)
I informed pt the last terbinafine rx had been sent to her pharmacy and she would need to make an appt in 6-9 months for reevaluation.

## 2018-10-10 ENCOUNTER — Other Ambulatory Visit: Payer: Self-pay | Admitting: Podiatry

## 2019-02-02 ENCOUNTER — Ambulatory Visit: Payer: 59 | Admitting: Endocrinology

## 2019-02-13 ENCOUNTER — Other Ambulatory Visit: Payer: Self-pay

## 2019-02-13 NOTE — Progress Notes (Signed)
53 y.o. G7P1011 Married Black or [place] American [name] here for annual exam.  No vaginal bleeding. No dyspareunia.   Followed by Dr Whitney Patton for a multinodular goiter.     Patient's last menstrual period was 07/18/2017 (approximate).          Sexually active: Yes.    The current method of family planning is post menopausal status.    Exercising: No.  The patient does not participate in regular exercise at present. Smoker:  Yes, smoking ~1.5 packs per week.   Health Maintenance: Pap:01/14/16 Pap and HR HPV negative History of abnormal Pap:Yes, years ago, no surgery on her cervix.  MMG:03/31/2018 Birads 1 negative Colonoscopy:05/07/16 Polyp removed -- normal repeat 5 years [id number] TDaP:12/24/15 Gardasil:N/A   reports that she has been smoking cigarettes. She has never used smokeless tobacco. She reports current alcohol use of about 7.0 standard drinks of alcohol per week. She reports that she does not use drugs. 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 She has taken on one of her grandsons 2 siblings as additional grandsons. Boys range in age from 69 to 26.  Past Medical History:  Diagnosis Date  . Allergy   . Hypertension   . Multinodular goiter     History reviewed. No pertinent surgical history.  Current Outpatient Medications  Medication Sig Dispense Refill  . amLODipine (NORVASC) 5 MG tablet TAKE 1 TABLET (5 MG TOTAL) BY MOUTH DAILY. PATIENT NEEDS OFFICE VISIT BEFORE REFILLS WILL BE GIVEN 90 tablet 3  . Calcium Carbonate-Vit D-Min (CALTRATE 600+D PLUS PO) Take by mouth.    . Dermatological Products, Misc. (NONYX) GEL Apply topically.    . halobetasol (ULTRAVATE) 0.05 % cream APPLY TOPICALLY 2 (TWO) TIMES DAILY. 50 g 1  . irbesartan-hydrochlorothiazide (AVALIDE) 300-12.5 MG tablet TAKE 1 TABLET BY MOUTH DAILY. 90 tablet 3  . Multiple Vitamin (MULTIVITAMIN PO) Take by mouth.    . vitamin C (ASCORBIC ACID) 500 MG tablet  Take 500 mg by mouth daily.     Current Facility-Administered Medications  Medication Dose Route Frequency Provider Last Rate Last Dose  . 0.9 %  sodium chloride infusion  500 mL Intravenous Continuous Irene Shipper, MD        Family History  Problem Relation Age of Onset  . Lung cancer Mother   . Diabetes Father   . Diabetes Brother   . Colon cancer Neg Hx   . Colon polyps Neg Hx   . Esophageal cancer Neg Hx   . Rectal cancer Neg Hx   . Stomach cancer Neg Hx   . Thyroid disease Neg Hx     Review of Systems  Constitutional: Negative.   HENT: Negative.   Eyes: Negative.   Respiratory: Negative.   Cardiovascular: Negative.   Gastrointestinal: Negative.   Endocrine: Negative.   Genitourinary: Negative.   Musculoskeletal: Negative.   Skin: Negative.   Allergic/Immunologic: Negative.   Neurological: Negative.   Hematological: Negative.   Psychiatric/Behavioral: Negative.     Exam:   BP 132/84 (BP Location: Right Arm, Patient Position: Sitting, Cuff Size: Normal)   Pulse 88   Temp (!) 97.2 F (36.2 C) (Skin)   Ht 5\' 3"  (1.6 m)   Wt 150 lb 9.6 oz (68.3 kg)   LMP 07/18/2017 (Approximate)   BMI 26.68 kg/m   Weight change: @WEIGHTCHANGE @ Height:   Height: 5\' 3"  (160 cm)  Ht Readings from Last 3 Encounters:  02/14/19 5\' 3"  (  1.6 m)  02/08/18 5\' 3"  (1.6 m)  02/03/18 5\' 3"  (1.6 m)    General appearance: alert, cooperative and appears stated age Head: Normocephalic, without obvious abnormality, atraumatic Neck: no adenopathy, supple, symmetrical, trachea midline and thyroid right lobe>left lobe Lungs: clear to auscultation bilaterally Cardiovascular: regular rate and rhythm Breasts: normal appearance, no masses or tenderness Abdomen: soft, non-tender; non distended,  no masses,  no organomegaly Extremities: extremities normal, atraumatic, no cyanosis or edema Skin: Skin color, texture, turgor normal. No rashes or lesions Lymph nodes: Cervical, supraclavicular, and  axillary nodes normal. No abnormal inguinal nodes palpated Neurologic: Grossly normal   Pelvic: External genitalia:  no lesions              Urethra:  normal appearing urethra with no masses, tenderness or lesions              Bartholins and Skenes: normal                 Vagina: atrophic appearing vagina with normal color and discharge, no lesions              Cervix: no lesions               Bimanual Exam:  Uterus:  normal size, contour, position, consistency, mobility, non-tender              Adnexa: no mass, fullness, tenderness               Rectovaginal: Confirms               Anus:  normal sphincter tone, no lesions  Chaperone was present for exam.  A:  Well Woman with normal exam  HTN followed by primary  Multinodular goiter followed by Endocrinology  Smoker  P:   No pap this year  Mammogram 12/20  Colonoscopy UTD  Labs with primary  Discussed breast self exam  Discussed calcium and vit D intake  Given information on smoking cessation, not interested in medication

## 2019-02-14 ENCOUNTER — Ambulatory Visit (INDEPENDENT_AMBULATORY_CARE_PROVIDER_SITE_OTHER): Payer: 59 | Admitting: Obstetrics and Gynecology

## 2019-02-14 ENCOUNTER — Encounter: Payer: Self-pay | Admitting: Obstetrics and Gynecology

## 2019-02-14 VITALS — BP 132/84 | HR 88 | Temp 97.2°F | Ht 63.0 in | Wt 150.6 lb

## 2019-02-14 DIAGNOSIS — R7303 Prediabetes: Secondary | ICD-10-CM | POA: Insufficient documentation

## 2019-02-14 DIAGNOSIS — E042 Nontoxic multinodular goiter: Secondary | ICD-10-CM

## 2019-02-14 DIAGNOSIS — Z01419 Encounter for gynecological examination (general) (routine) without abnormal findings: Secondary | ICD-10-CM

## 2019-02-14 DIAGNOSIS — F17200 Nicotine dependence, unspecified, uncomplicated: Secondary | ICD-10-CM

## 2019-02-14 DIAGNOSIS — I10 Essential (primary) hypertension: Secondary | ICD-10-CM | POA: Diagnosis not present

## 2019-02-14 HISTORY — DX: Prediabetes: R73.03

## 2019-02-14 NOTE — Patient Instructions (Addendum)
EXERCISE AND DIET:  We recommended that you start or continue a regular exercise program for good health. Regular exercise means any activity that makes your heart beat faster and makes you sweat.  We recommend exercising at least 30 minutes per day at least 3 days a week, preferably 4 or 5.  We also recommend a diet low in fat and sugar.  Inactivity, poor dietary choices and obesity can cause diabetes, heart attack, stroke, and kidney damage, among others.    ALCOHOL AND SMOKING:  Women should limit their alcohol intake to no more than 7 drinks/beers/glasses of wine (combined, not each!) per week. Moderation of alcohol intake to this level decreases your risk of breast cancer and liver damage. And of course, no recreational drugs are part of a healthy lifestyle.  And absolutely no smoking or even second hand smoke. Most people know smoking can cause heart and lung diseases, but did you know it also contributes to weakening of your bones? Aging of your skin?  Yellowing of your teeth and nails?  CALCIUM AND VITAMIN D:  Adequate intake of calcium and Vitamin D are recommended.  The recommendations for exact amounts of these supplements seem to change often, but generally speaking 1,200 mg of calcium (between diet and supplement) and 800 units of Vitamin D per day seems prudent. Certain women may benefit from higher intake of Vitamin D.  If you are among these women, your doctor will have told you during your visit.    PAP SMEARS:  Pap smears, to check for cervical cancer or precancers,  have traditionally been done yearly, although recent scientific advances have shown that most women can have pap smears less often.  However, every woman still should have a physical exam from her gynecologist every year. It will include a breast check, inspection of the vulva and vagina to check for abnormal growths or skin changes, a visual exam of the cervix, and then an exam to evaluate the size and shape of the uterus and  ovaries.  And after 54 years of age, a rectal exam is indicated to check for rectal cancers. We will also provide age appropriate advice regarding health maintenance, like when you should have certain vaccines, screening for sexually transmitted diseases, bone density testing, colonoscopy, mammograms, etc.   MAMMOGRAMS:  All women over 40 years old should have a yearly mammogram. Many facilities now offer a "3D" mammogram, which may cost around $50 extra out of pocket. If possible,  we recommend you accept the option to have the 3D mammogram performed.  It both reduces the number of women who will be called back for extra views which then turn out to be normal, and it is better than the routine mammogram at detecting truly abnormal areas.    COLON CANCER SCREENING: Now recommend starting at age 45. At this time colonoscopy is not covered for routine screening until 50. There are take home tests that can be done between 45-49.   COLONOSCOPY:  Colonoscopy to screen for colon cancer is recommended for all women at age 50.  We know, you hate the idea of the prep.  We agree, BUT, having colon cancer and not knowing it is worse!!  Colon cancer so often starts as a polyp that can be seen and removed at colonscopy, which can quite literally save your life!  And if your first colonoscopy is normal and you have no family history of colon cancer, most women don't have to have it again for   10 years.  Once every ten years, you can do something that may end up saving your life, right?  We will be happy to help you get it scheduled when you are ready.  Be sure to check your insurance coverage so you understand how much it will cost.  It may be covered as a preventative service at no cost, but you should check your particular policy.      Breast Self-Awareness Breast self-awareness means being familiar with how your breasts look and feel. It involves checking your breasts regularly and reporting any changes to your  health care provider. Practicing breast self-awareness is important. A change in your breasts can be a sign of a serious medical problem. Being familiar with how your breasts look and feel allows you to find any problems early, when treatment is more likely to be successful. All women should practice breast self-awareness, including women who have had breast implants. How to do a breast self-exam One way to learn what is normal for your breasts and whether your breasts are changing is to do a breast self-exam. To do a breast self-exam: Look for Changes  1. Remove all the clothing above your waist. 2. Stand in front of a mirror in a room with good lighting. 3. Put your hands on your hips. 4. Push your hands firmly downward. 5. Compare your breasts in the mirror. Look for differences between them (asymmetry), such as: ? Differences in shape. ? Differences in size. ? Puckers, dips, and bumps in one breast and not the other. 6. Look at each breast for changes in your skin, such as: ? Redness. ? Scaly areas. 7. Look for changes in your nipples, such as: ? Discharge. ? Bleeding. ? Dimpling. ? Redness. ? A change in position. Feel for Changes Carefully feel your breasts for lumps and changes. It is best to do this while lying on your back on the floor and again while sitting or standing in the shower or tub with soapy water on your skin. Feel each breast in the following way:  Place the arm on the side of the breast you are examining above your head.  Feel your breast with the other hand.  Start in the nipple area and make  inch (2 cm) overlapping circles to feel your breast. Use the pads of your three middle fingers to do this. Apply light pressure, then medium pressure, then firm pressure. The light pressure will allow you to feel the tissue closest to the skin. The medium pressure will allow you to feel the tissue that is a little deeper. The firm pressure will allow you to feel the tissue  close to the ribs.  Continue the overlapping circles, moving downward over the breast until you feel your ribs below your breast.  Move one finger-width toward the center of the body. Continue to use the  inch (2 cm) overlapping circles to feel your breast as you move slowly up toward your collarbone.  Continue the up and down exam using all three pressures until you reach your armpit.  Write Down What You Find  Write down what is normal for each breast and any changes that you find. Keep a written record with breast changes or normal findings for each breast. By writing this information down, you do not need to depend only on memory for size, tenderness, or location. Write down where you are in your menstrual cycle, if you are still menstruating. If you are having trouble noticing differences   in your breasts, do not get discouraged. With time you will become more familiar with the variations in your breasts and more comfortable with the exam. How often should I examine my breasts? Examine your breasts every month. If you are breastfeeding, the best time to examine your breasts is after a feeding or after using a breast pump. If you menstruate, the best time to examine your breasts is 5-7 days after your period is over. During your period, your breasts are lumpier, and it may be more difficult to notice changes. When should I see my health care provider? See your health care provider if you notice:  A change in shape or size of your breasts or nipples.  A change in the skin of your breast or nipples, such as a reddened or scaly area.  Unusual discharge from your nipples.  A lump or thick area that was not there before.  Pain in your breasts.  Anything that concerns you.   Steps to Quit Smoking Smoking tobacco is the leading cause of preventable death. It can affect almost every organ in the body. Smoking puts you and those around you at risk for developing many serious chronic  diseases. Quitting smoking can be difficult, but it is one of the best things that you can do for your health. It is never too late to quit. How do I get ready to quit? When you decide to quit smoking, create a plan to help you succeed. Before you quit:  Pick a date to quit. Set a date within the next 2 weeks to give you time to prepare.  Write down the reasons why you are quitting. Keep this list in places where you will see it often.  Tell your family, friends, and co-workers that you are quitting. Support from your loved ones can make quitting easier.  Talk with your health care provider about your options for quitting smoking.  Find out what treatment options are covered by your health insurance.  Identify people, places, things, and activities that make you want to smoke (triggers). Avoid them. What first steps can I take to quit smoking?  Throw away all cigarettes at home, at work, and in your car.  Throw away smoking accessories, such as ashtrays and lighters.  Clean your car. Make sure to empty the ashtray.  Clean your home, including curtains and carpets. What strategies can I use to quit smoking? Talk with your health care provider about combining strategies, such as taking medicines while you are also receiving in-person counseling. Using these two strategies together makes you more likely to succeed in quitting than if you used either strategy on its own.  If you are pregnant or breastfeeding, talk with your health care provider about finding counseling or other support strategies to quit smoking. Do not take medicine to help you quit smoking unless your health care provider tells you to do so. To quit smoking: Quit right away  Quit smoking completely, instead of gradually reducing how much you smoke over a period of time. Research shows that stopping smoking right away is more successful than gradually quitting.  Attend in-person counseling to help you build  problem-solving skills. You are more likely to succeed in quitting if you attend counseling sessions regularly. Even short sessions of 10 minutes can be effective. Take medicine You may take medicines to help you quit smoking. Some medicines require a prescription and some you can purchase over-the-counter. Medicines may have nicotine in them to replace the   nicotine in cigarettes. Medicines may:  Help to stop cravings.  Help to relieve withdrawal symptoms. Your health care provider may recommend:  Nicotine patches, gum, or lozenges.  Nicotine inhalers or sprays.  Non-nicotine medicine that is taken by mouth. Find resources Find resources and support systems that can help you to quit smoking and remain smoke-free after you quit. These resources are most helpful when you use them often. They include:  Online chats with a Social worker.  Telephone quitlines.  Printed Furniture conservator/restorer.  Support groups or group counseling.  Text messaging programs.  Mobile phone apps or applications. Use apps that can help you stick to your quit plan by providing reminders, tips, and encouragement. There are many free apps for mobile devices as well as websites. Examples include Quit Guide from the [employer] and smokefree.gov What things can I do to make it easier to quit?   Reach out to your family and friends for support and encouragement. Call telephone quitlines (1-800-QUIT-NOW), reach out to support groups, or work with a counselor for support.  Ask people who smoke to avoid smoking around you.  Avoid places that trigger you to smoke, such as bars, parties, or smoke-break areas at work.  Spend time with people who do not smoke.  Lessen the stress in your life. Stress can be a smoking trigger for some people. To lessen stress, try: ? Exercising regularly. ? Doing deep-breathing exercises. ? Doing yoga. ? Meditating. ? Performing a body scan. This involves closing your eyes, scanning your body from  head to toe, and noticing which parts of your body are particularly tense. Try to relax the muscles in those areas. How will I feel when I quit smoking? Day 1 to 3 weeks Within the first 24 hours of quitting smoking, you may start to feel withdrawal symptoms. These symptoms are usually most noticeable 2-3 days after quitting, but they usually do not last for more than 2-3 weeks. You may experience these symptoms:  Mood swings.  Restlessness, anxiety, or irritability.  Trouble concentrating.  Dizziness.  Strong cravings for sugary foods and nicotine.  Mild weight gain.  Constipation.  Nausea.  Coughing or a sore throat.  Changes in how the medicines that you take for unrelated issues work in your body.  Depression.  Trouble sleeping (insomnia). Week 3 and afterward After the first 2-3 weeks of quitting, you may start to notice more positive results, such as:  Improved sense of smell and taste.  Decreased coughing and sore throat.  Slower heart rate.  Lower blood pressure.  Clearer skin.  The ability to breathe more easily.  Fewer sick days. Quitting smoking can be very challenging. Do not get discouraged if you are not successful the first time. Some people need to make many attempts to quit before they achieve long-term success. Do your best to stick to your quit plan, and talk with your health care provider if you have any questions or concerns. Summary  Smoking tobacco is the leading cause of preventable death. Quitting smoking is one of the best things that you can do for your health.  When you decide to quit smoking, create a plan to help you succeed.  Quit smoking right away, not slowly over a period of time.  When you start quitting, seek help from your health care provider, family, or friends. This information is not intended to replace advice given to you by your health care provider. Make sure you discuss any questions you have with your health  care  provider. Document Released: 03/30/2001 Document Revised: 06/23/2018 Document Reviewed: 06/24/2018 Elsevier Patient Education  2020 [employer].

## 2019-02-16 ENCOUNTER — Ambulatory Visit: Payer: 59 | Admitting: Endocrinology

## 2019-02-21 NOTE — Telephone Encounter (Signed)
She can repeat it one more time

## 2019-03-09 ENCOUNTER — Other Ambulatory Visit: Payer: Self-pay | Admitting: Obstetrics and Gynecology

## 2019-03-09 DIAGNOSIS — Z1231 Encounter for screening mammogram for malignant neoplasm of breast: Secondary | ICD-10-CM

## 2019-03-28 ENCOUNTER — Other Ambulatory Visit: Payer: Self-pay

## 2019-03-30 ENCOUNTER — Encounter: Payer: Self-pay | Admitting: Endocrinology

## 2019-03-30 ENCOUNTER — Ambulatory Visit (INDEPENDENT_AMBULATORY_CARE_PROVIDER_SITE_OTHER): Payer: 59 | Admitting: Endocrinology

## 2019-03-30 VITALS — BP 124/82 | HR 106 | Ht 63.0 in | Wt 149.8 lb

## 2019-03-30 DIAGNOSIS — E042 Nontoxic multinodular goiter: Secondary | ICD-10-CM

## 2019-03-30 NOTE — Patient Instructions (Addendum)
Let's recheck the ultrasound.  you will receive a phone call, about a day and time for an appointment.   Blood tests are requested for you today.  We'll let you know about the results.   Please come back for a follow-up appointment in 1 year.

## 2019-03-30 NOTE — Progress Notes (Signed)
Subjective:    Patient ID: Whitney Patton, female    DOB: [birth year], 53 y.o.   MRN: [id number]  HPI Pt returns for f/u of Multinodular goiter (dx'ed 2001; [place] in 2018 showed: recommend FNA mid right nodule: 4.9 x 2.1 x 2.8 cm, compared to 3.1 x 1.4 x 1.8 cm (2001); bx (2018): showed BENIGN FOLLICULAR NODULE (BETHESDA CATEGORY II)).  pt states she feels well in general.  She does not notice the goiter.   Past Medical History:  Diagnosis Date  . Allergy   . Hypertension   . Multinodular goiter   . Prediabetes 02/14/2019    No past surgical history on file.  Social History   Socioeconomic History  . Marital status: Married    Spouse name: Not on file  . Number of children: Not on file  . Years of education: Not on file  . Highest education level: Not on file  Occupational History  . Not on file  Tobacco Use  . Smoking status: Current Every Day Smoker    Types: Cigarettes  . Smokeless tobacco: Never Used  Substance and Sexual Activity  . Alcohol use: Yes    Alcohol/week: 7.0 standard drinks    Types: 7 Glasses of wine per week  . Drug use: No  . Sexual activity: Yes    Partners: Male    Birth control/protection: Post-menopausal  Other Topics Concern  . Not on file  Social History Narrative  . Not on file   Social Determinants of Health   Financial Resource Strain:   . Difficulty of Paying Living Expenses: Not on file  Food Insecurity:   . Worried About Charity fundraiser in the Last Year: Not on file  . Ran Out of Food in the Last Year: Not on file  Transportation Needs:   . Lack of Transportation (Medical): Not on file  . Lack of Transportation (Non-Medical): Not on file  Physical Activity:   . Days of Exercise per Week: Not on file  . Minutes of Exercise per Session: Not on file  Stress:   . Feeling of Stress : Not on file  Social Connections:   . Frequency of Communication with Friends and Family: Not on file  . Frequency of Social Gatherings with Friends  and Family: Not on file  . Attends Religious Services: Not on file  . Active Member of Clubs or Organizations: Not on file  . Attends Archivist Meetings: Not on file  . Marital Status: Not on file  Intimate Partner Violence:   . Fear of Current or Ex-Partner: Not on file  . Emotionally Abused: Not on file  . Physically Abused: Not on file  . Sexually Abused: Not on file    Current Outpatient Medications on File Prior to Visit  Medication Sig Dispense Refill  . amLODipine (NORVASC) 5 MG tablet TAKE 1 TABLET (5 MG TOTAL) BY MOUTH DAILY. PATIENT NEEDS OFFICE VISIT BEFORE REFILLS WILL BE GIVEN 90 tablet 3  . Calcium Carbonate-Vit D-Min (CALTRATE 600+D PLUS PO) Take by mouth.    . Dermatological Products, Misc. (NONYX) GEL Apply topically.    . halobetasol (ULTRAVATE) 0.05 % cream APPLY TOPICALLY 2 (TWO) TIMES DAILY. 50 g 1  . irbesartan-hydrochlorothiazide (AVALIDE) 300-12.5 MG tablet TAKE 1 TABLET BY MOUTH DAILY. 90 tablet 3  . Multiple Vitamin (MULTIVITAMIN PO) Take by mouth.    . vitamin C (ASCORBIC ACID) 500 MG tablet Take 500 mg by mouth daily.  Current Facility-Administered Medications on File Prior to Visit  Medication Dose Route Frequency Provider Last Rate Last Admin  . 0.9 %  sodium chloride infusion  500 mL Intravenous Continuous Irene Shipper, MD        No Known Allergies  Family History  Problem Relation Age of Onset  . Lung cancer Mother   . Diabetes Father   . Diabetes Brother   . Colon cancer Neg Hx   . Colon polyps Neg Hx   . Esophageal cancer Neg Hx   . Rectal cancer Neg Hx   . Stomach cancer Neg Hx   . Thyroid disease Neg Hx     BP 124/82 (BP Location: Left Arm, Patient Position: Sitting, Cuff Size: Normal)   Pulse (!) 106   Ht 5\' 3"  (1.6 m)   Wt 149 lb 12.8 oz (67.9 kg)   LMP 07/18/2017 (Approximate)   SpO2 94%   BMI 26.54 kg/m    Review of Systems Denies neck pain.      Objective:   Physical Exam VITAL SIGNS:  See vs  page GENERAL: no distress.  NECK: 4 cm right thyroid mass is again noted.    Lab Results  Component Value Date   TSH 1.220 03/30/2019   T4TOTAL 5.9 01/19/2017       Assessment & Plan:  Thyroid nodule, clinically unchanged.   Patient Instructions  Let's recheck the ultrasound.  you will receive a phone call, about a day and time for an appointment.   Blood tests are requested for you today.  We'll let you know about the results.   Please come back for a follow-up appointment in 1 year.

## 2019-03-31 LAB — T4, FREE: Free T4: 1.04 ng/dL (ref 0.82–1.77)

## 2019-03-31 LAB — TSH: TSH: 1.22 u[IU]/mL (ref 0.450–4.500)

## 2019-04-02 ENCOUNTER — Telehealth: Payer: Self-pay

## 2019-04-02 NOTE — Telephone Encounter (Signed)
-----   Message from Renato Shin, MD sent at 03/31/2019  9:46 AM EST ----- please contact patient: Normal--good

## 2019-04-02 NOTE — Telephone Encounter (Signed)
Lab results reviewed by Dr. Ellison. Letter has been mailed. For future reference, letter can be found in Epic. 

## 2019-04-03 ENCOUNTER — Other Ambulatory Visit: Payer: Self-pay | Admitting: Internal Medicine

## 2019-04-17 ENCOUNTER — Ambulatory Visit
Admission: RE | Admit: 2019-04-17 | Discharge: 2019-04-17 | Disposition: A | Discharge status: Home / Self Care | Payer: 59 | Source: Ambulatory Visit | Attending: Endocrinology | Admitting: Endocrinology

## 2019-04-17 DIAGNOSIS — E042 Nontoxic multinodular goiter: Secondary | ICD-10-CM

## 2019-05-02 ENCOUNTER — Other Ambulatory Visit: Payer: Self-pay

## 2019-05-02 ENCOUNTER — Ambulatory Visit
Admission: RE | Admit: 2019-05-02 | Discharge: 2019-05-02 | Disposition: A | Discharge status: Home / Self Care | Payer: 59 | Source: Ambulatory Visit | Attending: Obstetrics and Gynecology | Admitting: Obstetrics and Gynecology

## 2019-05-02 DIAGNOSIS — Z1231 Encounter for screening mammogram for malignant neoplasm of breast: Secondary | ICD-10-CM

## 2019-05-04 ENCOUNTER — Other Ambulatory Visit: Payer: Self-pay | Admitting: Obstetrics and Gynecology

## 2019-05-04 DIAGNOSIS — R928 Other abnormal and inconclusive findings on diagnostic imaging of breast: Secondary | ICD-10-CM

## 2019-05-16 ENCOUNTER — Ambulatory Visit
Admission: RE | Admit: 2019-05-16 | Discharge: 2019-05-16 | Disposition: A | Discharge status: Home / Self Care | Payer: 59 | Source: Ambulatory Visit | Attending: Obstetrics and Gynecology | Admitting: Obstetrics and Gynecology

## 2019-05-16 ENCOUNTER — Other Ambulatory Visit: Payer: Self-pay | Admitting: Obstetrics and Gynecology

## 2019-05-16 ENCOUNTER — Other Ambulatory Visit: Payer: Self-pay

## 2019-05-16 DIAGNOSIS — R921 Mammographic calcification found on diagnostic imaging of breast: Secondary | ICD-10-CM

## 2019-05-16 DIAGNOSIS — R928 Other abnormal and inconclusive findings on diagnostic imaging of breast: Secondary | ICD-10-CM

## 2019-05-23 ENCOUNTER — Other Ambulatory Visit: Payer: Self-pay

## 2019-05-23 ENCOUNTER — Other Ambulatory Visit: Payer: Self-pay | Admitting: Internal Medicine

## 2019-05-24 ENCOUNTER — Ambulatory Visit
Admission: RE | Admit: 2019-05-24 | Discharge: 2019-05-24 | Disposition: A | Discharge status: Home / Self Care | Payer: 59 | Source: Ambulatory Visit | Attending: Obstetrics and Gynecology | Admitting: Obstetrics and Gynecology

## 2019-05-24 DIAGNOSIS — R921 Mammographic calcification found on diagnostic imaging of breast: Secondary | ICD-10-CM

## 2019-05-25 ENCOUNTER — Other Ambulatory Visit: Payer: Self-pay

## 2019-05-25 ENCOUNTER — Other Ambulatory Visit (HOSPITAL_COMMUNITY)
Admission: RE | Admit: 2019-05-25 | Discharge: 2019-05-25 | Disposition: A | Discharge status: Home / Self Care | Payer: 59 | Source: Ambulatory Visit | Attending: Endocrinology | Admitting: Endocrinology

## 2019-05-25 ENCOUNTER — Encounter: Payer: Self-pay | Admitting: Endocrinology

## 2019-05-25 ENCOUNTER — Ambulatory Visit (INDEPENDENT_AMBULATORY_CARE_PROVIDER_SITE_OTHER): Payer: 59 | Admitting: Endocrinology

## 2019-05-25 VITALS — BP 138/88 | HR 96 | Ht 63.0 in | Wt 147.4 lb

## 2019-05-25 DIAGNOSIS — E042 Nontoxic multinodular goiter: Secondary | ICD-10-CM | POA: Insufficient documentation

## 2019-05-25 NOTE — Progress Notes (Signed)
Subjective:    Patient ID: Whitney Patton, female    DOB: Nov 08, 1965, 54 y.o.   MRN: [id number]  HPI Pt returns for f/u of Multinodular goiter (dx'ed 2001; [place] in 2018 showed: recommend FNA mid right nodule: 4.9 x 2.1 x 2.8 cm, compared to 3.1 x 1.4 x 1.8 cm (2001); bx (2018): showed BENIGN FOLLICULAR NODULE (BETHESDA CATEGORY II)).  pt states she feels well in general.  She does not notice the goiter.  Past Medical History:  Diagnosis Date  . Allergy   . Hypertension   . Multinodular goiter   . Prediabetes 02/14/2019    No past surgical history on file.  Social History   Socioeconomic History  . Marital status: Married    Spouse name: Not on file  . Number of children: Not on file  . Years of education: Not on file  . Highest education level: Not on file  Occupational History  . Not on file  Tobacco Use  . Smoking status: Current Every Day Smoker    Types: Cigarettes  . Smokeless tobacco: Never Used  Substance and Sexual Activity  . Alcohol use: Yes    Alcohol/week: 7.0 standard drinks    Types: 7 Glasses of wine per week  . Drug use: No  . Sexual activity: Yes    Partners: Male    Birth control/protection: Post-menopausal  Other Topics Concern  . Not on file  Social History Narrative  . Not on file   Social Determinants of Health   Financial Resource Strain:   . Difficulty of Paying Living Expenses: Not on file  Food Insecurity:   . Worried About Charity fundraiser in the Last Year: Not on file  . Ran Out of Food in the Last Year: Not on file  Transportation Needs:   . Lack of Transportation (Medical): Not on file  . Lack of Transportation (Non-Medical): Not on file  Physical Activity:   . Days of Exercise per Week: Not on file  . Minutes of Exercise per Session: Not on file  Stress:   . Feeling of Stress : Not on file  Social Connections:   . Frequency of Communication with Friends and Family: Not on file  . Frequency of Social Gatherings with Friends  and Family: Not on file  . Attends Religious Services: Not on file  . Active Member of Clubs or Organizations: Not on file  . Attends Archivist Meetings: Not on file  . Marital Status: Not on file  Intimate Partner Violence:   . Fear of Current or Ex-Partner: Not on file  . Emotionally Abused: Not on file  . Physically Abused: Not on file  . Sexually Abused: Not on file    Current Outpatient Medications on File Prior to Visit  Medication Sig Dispense Refill  . amLODipine (NORVASC) 5 MG tablet TAKE 1 TABLET (5 MG TOTAL) BY MOUTH DAILY. PATIENT NEEDS OFFICE VISIT BEFORE REFILLS WILL BE GIVEN 90 tablet 3  . Calcium Carbonate-Vit D-Min (CALTRATE 600+D PLUS PO) Take by mouth.    . Dermatological Products, Misc. (NONYX) GEL Apply topically.    . halobetasol (ULTRAVATE) 0.05 % cream APPLY TOPICALLY 2 (TWO) TIMES DAILY. 50 g 1  . irbesartan-hydrochlorothiazide (AVALIDE) 300-12.5 MG tablet TAKE 1 TABLET BY MOUTH DAILY. 90 tablet 0  . Multiple Vitamin (MULTIVITAMIN PO) Take by mouth.    . vitamin C (ASCORBIC ACID) 500 MG tablet Take 500 mg by mouth daily.     Current  Facility-Administered Medications on File Prior to Visit  Medication Dose Route Frequency Provider Last Rate Last Admin  . 0.9 %  sodium chloride infusion  500 mL Intravenous Continuous Irene Shipper, MD        No Known Allergies  Family History  Problem Relation Age of Onset  . Lung cancer Mother   . Diabetes Father   . Diabetes Brother   . Colon cancer Neg Hx   . Colon polyps Neg Hx   . Esophageal cancer Neg Hx   . Rectal cancer Neg Hx   . Stomach cancer Neg Hx   . Thyroid disease Neg Hx     BP 138/88 (BP Location: Left Arm, Patient Position: Sitting, Cuff Size: Normal)   Pulse 96   Ht 5\' 3"  (1.6 m)   Wt 147 lb 6.4 oz (66.9 kg)   LMP 07/18/2017 (Approximate)   SpO2 94%   BMI 26.11 kg/m    Review of Systems     Objective:   Physical Exam VITAL SIGNS:  See vs page GENERAL: no distress NECK:  There is no palpable thyroid enlargement.  No thyroid nodule is palpable.  No palpable lymphadenopathy at the anterior neck.     Lab Results  Component Value Date   TSH 1.220 03/30/2019   T4TOTAL 5.9 01/19/2017    thyroid needle bx: Location: right mid-thyroid nodule consent obtained, signed form on chart The area is first sprayed with cooling agent local: xylocaine 2%, with epinephrine prep: alcohol pad 4 bxs are done with 25 and [id number] needles no complications     Assessment & Plan:  MNG, s/p bx.  We discussed f/u of this  Patient Instructions  We'll let you know about the biopsy results.  If as expected, no cancer is found, please come back for a follow-up appointment in 1 year.

## 2019-05-25 NOTE — Patient Instructions (Addendum)
We'll let you know about the biopsy results.  If as expected, no cancer is found, please come back for a follow-up appointment in 1 year.

## 2019-05-29 ENCOUNTER — Telehealth: Payer: Self-pay

## 2019-05-29 LAB — CYTOLOGY - NON PAP

## 2019-05-29 NOTE — Telephone Encounter (Signed)
Lab results reviewed by Dr. Ellison. Letter has been mailed. For future reference, letter can be found in Epic. 

## 2019-05-29 NOTE — Telephone Encounter (Signed)
-----   Message from Renato Shin, MD sent at 05/29/2019 12:31 PM EST ----- please contact patient: No cancer is found--good.  Please come back for a follow-up appointment in 1 year.

## 2019-07-05 ENCOUNTER — Ambulatory Visit
Admission: RE | Admit: 2019-07-05 | Discharge: 2019-07-05 | Disposition: A | Discharge status: Home / Self Care | Payer: 59 | Source: Ambulatory Visit | Attending: Internal Medicine | Admitting: Internal Medicine

## 2019-07-05 ENCOUNTER — Other Ambulatory Visit: Payer: Self-pay | Admitting: Internal Medicine

## 2019-07-05 DIAGNOSIS — M25572 Pain in left ankle and joints of left foot: Secondary | ICD-10-CM

## 2019-08-28 ENCOUNTER — Other Ambulatory Visit: Payer: Self-pay | Admitting: Internal Medicine

## 2020-02-18 NOTE — Progress Notes (Signed)
54 y.o. [id number] Married Black or [place] American [name] here for annual exam.  No dyspareunia, no vaginal bleeding.   She had a breast biopsy and thyroid biopsy in 2/21, both benign.     Patient's last menstrual period was 07/18/2017 (approximate).          Sexually active: Yes.    The current method of family planning is post menopausal status.    Exercising: No.  The patient does not participate in regular exercise at present. Smoker:  Yes, smokes a pack every 5 days. She would like to quit. Doesn't want medication.   Health Maintenance: Pap:  01/14/16 Pap and HR HPV negative History of abnormal Pap:  Yes, years ago, no surgery on her cervix.  MMG:  05/16/19 left breast calcifications Bi-rads 4 suspicious.  05/25/19 BX revealed fibrocystic changes  BMD:   Never  Colonoscopy: 05/07/16 Polyp removed -- normal repeat 5 years TDaP:  12/24/15  Gardasil: N/A   reports that she has been smoking cigarettes. She has never used smokeless tobacco. She reports current alcohol use of about 7.0 standard drinks of alcohol per week. She reports that she does not use drugs. ETOH use ranges from 2-7 drinks a week. She works as a Probation officer. [place] daughter and 3 grand sons.She has taken on one of her grandsons 2 siblings as additional grandsons. Boys range in age from 80 to 49.  Past Medical History:  Diagnosis Date   Allergy    Hypertension    Multinodular goiter    Prediabetes 02/14/2019    History reviewed. No pertinent surgical history.  Current Outpatient Medications  Medication Sig Dispense Refill   amLODipine (NORVASC) 5 MG tablet TAKE 1 TABLET (5 MG TOTAL) BY MOUTH DAILY. PATIENT NEEDS OFFICE VISIT BEFORE REFILLS WILL BE GIVEN 90 tablet 3   Calcium Carbonate-Vit D-Min (CALTRATE 600+D PLUS PO) Take by mouth.     Dermatological Products, Misc. (NONYX) GEL Apply topically.     halobetasol (ULTRAVATE) 0.05 % cream APPLY TOPICALLY 2 (TWO) TIMES DAILY. 50 g 1    irbesartan-hydrochlorothiazide (AVALIDE) 300-12.5 MG tablet TAKE 1 TABLET BY MOUTH EVERY DAY 90 tablet 1   Multiple Vitamin (MULTIVITAMIN PO) Take by mouth.     vitamin C (ASCORBIC ACID) 500 MG tablet Take 500 mg by mouth daily.     Current Facility-Administered Medications  Medication Dose Route Frequency Provider Last Rate Last Admin   0.9 %  sodium chloride infusion  500 mL Intravenous Continuous Irene Shipper, MD        Family History  Problem Relation Age of Onset   Lung cancer Mother    Diabetes Father    Diabetes Brother    Colon cancer Neg Hx    Colon polyps Neg Hx    Esophageal cancer Neg Hx    Rectal cancer Neg Hx    Stomach cancer Neg Hx    Thyroid disease Neg Hx     Review of Systems  All other systems reviewed and are negative.   Exam:   BP 124/72    Pulse 86    Ht 5' 3.5" (1.613 m)    Wt 147 lb (66.7 kg)    LMP 07/18/2017 (Approximate)    SpO2 96%    BMI 25.63 kg/m   Weight change: @WEIGHTCHANGE @ Height:   Height: 5' 3.5" (161.3 cm)  Ht Readings from Last 3 Encounters:  02/20/20 5' 3.5" (1.613 m)  05/25/19 5\' 3"  (1.6 m)  03/30/19  5\' 3"  (1.6 m)    General appearance: alert, cooperative and appears stated age Head: Normocephalic, without obvious abnormality, atraumatic Neck: no adenopathy, supple, symmetrical, trachea midline and thyroid normal to inspection and palpation Lungs: clear to auscultation bilaterally Cardiovascular: regular rate and rhythm Breasts: normal appearance, no masses or tenderness Abdomen: soft, non-tender; non distended,  no masses,  no organomegaly Extremities: extremities normal, atraumatic, no cyanosis or edema Skin: Skin color, texture, turgor normal. No rashes or lesions Lymph nodes: Cervical, supraclavicular, and axillary nodes normal. No abnormal inguinal nodes palpated Neurologic: Grossly normal   Pelvic: External genitalia:  no lesions              Urethra:  normal appearing urethra with no masses, tenderness or  lesions              Bartholins and Skenes: normal                 Vagina: atrophic appearing vagina with a slight increase in yellow, watery discharge (she denies symptoms)              Cervix: no lesions               Bimanual Exam:  Uterus:  normal size, contour, position, consistency, mobility, non-tender              Adnexa: no mass, fullness, tenderness               Rectovaginal: Confirms               Anus:  normal sphincter tone, no lesions  Shanon Petty chaperoned for the exam.  A:  Well Woman with normal exam  H/O HTN and prediabetes  Smoker  P:   No pap this year  Labs with primary  Mammogram in 1/22  Colonoscopy UTD  Discussed breast self exam  Discussed calcium and vit D intake  Labs with primary  Information on smoking cessation given

## 2020-02-20 ENCOUNTER — Encounter: Payer: Self-pay | Admitting: Obstetrics and Gynecology

## 2020-02-20 ENCOUNTER — Other Ambulatory Visit: Payer: Self-pay

## 2020-02-20 ENCOUNTER — Ambulatory Visit (INDEPENDENT_AMBULATORY_CARE_PROVIDER_SITE_OTHER): Payer: 59 | Admitting: Obstetrics and Gynecology

## 2020-02-20 VITALS — BP 124/72 | HR 86 | Ht 63.5 in | Wt 147.0 lb

## 2020-02-20 DIAGNOSIS — F17200 Nicotine dependence, unspecified, uncomplicated: Secondary | ICD-10-CM

## 2020-02-20 DIAGNOSIS — Z01419 Encounter for gynecological examination (general) (routine) without abnormal findings: Secondary | ICD-10-CM

## 2020-02-20 NOTE — Patient Instructions (Signed)
EXERCISE AND DIET:  We recommended that you start or continue a regular exercise program for good health. Regular exercise means any activity that makes your heart beat faster and makes you sweat.  We recommend exercising at least 30 minutes per day at least 3 days a week, preferably 4 or 5.  We also recommend a diet low in fat and sugar.  Inactivity, poor dietary choices and obesity can cause diabetes, heart attack, stroke, and kidney damage, among others.    ALCOHOL AND SMOKING:  Women should limit their alcohol intake to no more than 7 drinks/beers/glasses of wine (combined, not each!) per week. Moderation of alcohol intake to this level decreases your risk of breast cancer and liver damage. And of course, no recreational drugs are part of a healthy lifestyle.  And absolutely no smoking or even second hand smoke. Most people know smoking can cause heart and lung diseases, but did you know it also contributes to weakening of your bones? Aging of your skin?  Yellowing of your teeth and nails?  CALCIUM AND VITAMIN D:  Adequate intake of calcium and Vitamin D are recommended.  The recommendations for exact amounts of these supplements seem to change often, but generally speaking 1,200 mg of calcium (between diet and supplement) and 800 units of Vitamin D per day seems prudent. Certain women may benefit from higher intake of Vitamin D.  If you are among these women, your doctor will have told you during your visit.    PAP SMEARS:  Pap smears, to check for cervical cancer or precancers,  have traditionally been done yearly, although recent scientific advances have shown that most women can have pap smears less often.  However, every woman still should have a physical exam from her gynecologist every year. It will include a breast check, inspection of the vulva and vagina to check for abnormal growths or skin changes, a visual exam of the cervix, and then an exam to evaluate the size and shape of the uterus and  ovaries.  And after 55 years of age, a rectal exam is indicated to check for rectal cancers. We will also provide age appropriate advice regarding health maintenance, like when you should have certain vaccines, screening for sexually transmitted diseases, bone density testing, colonoscopy, mammograms, etc.   MAMMOGRAMS:  All women over 40 years old should have a yearly mammogram. Many facilities now offer a "3D" mammogram, which may cost around $50 extra out of pocket. If possible,  we recommend you accept the option to have the 3D mammogram performed.  It both reduces the number of women who will be called back for extra views which then turn out to be normal, and it is better than the routine mammogram at detecting truly abnormal areas.    COLON CANCER SCREENING: Now recommend starting at age 45. At this time colonoscopy is not covered for routine screening until 50. There are take home tests that can be done between 45-49.   COLONOSCOPY:  Colonoscopy to screen for colon cancer is recommended for all women at age 50.  We know, you hate the idea of the prep.  We agree, BUT, having colon cancer and not knowing it is worse!!  Colon cancer so often starts as a polyp that can be seen and removed at colonscopy, which can quite literally save your life!  And if your first colonoscopy is normal and you have no family history of colon cancer, most women don't have to have it again for   10 years.  Once every ten years, you can do something that may end up saving your life, right?  We will be happy to help you get it scheduled when you are ready.  Be sure to check your insurance coverage so you understand how much it will cost.  It may be covered as a preventative service at no cost, but you should check your particular policy.      Breast Self-Awareness Breast self-awareness means being familiar with how your breasts look and feel. It involves checking your breasts regularly and reporting any changes to your  health care provider. Practicing breast self-awareness is important. A change in your breasts can be a sign of a serious medical problem. Being familiar with how your breasts look and feel allows you to find any problems early, when treatment is more likely to be successful. All women should practice breast self-awareness, including women who have had breast implants. How to do a breast self-exam One way to learn what is normal for your breasts and whether your breasts are changing is to do a breast self-exam. To do a breast self-exam: Look for Changes  1. Remove all the clothing above your waist. 2. Stand in front of a mirror in a room with good lighting. 3. Put your hands on your hips. 4. Push your hands firmly downward. 5. Compare your breasts in the mirror. Look for differences between them (asymmetry), such as: ? Differences in shape. ? Differences in size. ? Puckers, dips, and bumps in one breast and not the other. 6. Look at each breast for changes in your skin, such as: ? Redness. ? Scaly areas. 7. Look for changes in your nipples, such as: ? Discharge. ? Bleeding. ? Dimpling. ? Redness. ? A change in position. Feel for Changes Carefully feel your breasts for lumps and changes. It is best to do this while lying on your back on the floor and again while sitting or standing in the shower or tub with soapy water on your skin. Feel each breast in the following way:  Place the arm on the side of the breast you are examining above your head.  Feel your breast with the other hand.  Start in the nipple area and make  inch (2 cm) overlapping circles to feel your breast. Use the pads of your three middle fingers to do this. Apply light pressure, then medium pressure, then firm pressure. The light pressure will allow you to feel the tissue closest to the skin. The medium pressure will allow you to feel the tissue that is a little deeper. The firm pressure will allow you to feel the tissue  close to the ribs.  Continue the overlapping circles, moving downward over the breast until you feel your ribs below your breast.  Move one finger-width toward the center of the body. Continue to use the  inch (2 cm) overlapping circles to feel your breast as you move slowly up toward your collarbone.  Continue the up and down exam using all three pressures until you reach your armpit.  Write Down What You Find  Write down what is normal for each breast and any changes that you find. Keep a written record with breast changes or normal findings for each breast. By writing this information down, you do not need to depend only on memory for size, tenderness, or location. Write down where you are in your menstrual cycle, if you are still menstruating. If you are having trouble noticing differences   in your breasts, do not get discouraged. With time you will become more familiar with the variations in your breasts and more comfortable with the exam. How often should I examine my breasts? Examine your breasts every month. If you are breastfeeding, the best time to examine your breasts is after a feeding or after using a breast pump. If you menstruate, the best time to examine your breasts is 5-7 days after your period is over. During your period, your breasts are lumpier, and it may be more difficult to notice changes. When should I see my health care provider? See your health care provider if you notice:  A change in shape or size of your breasts or nipples.  A change in the skin of your breast or nipples, such as a reddened or scaly area.  Unusual discharge from your nipples.  A lump or thick area that was not there before.  Pain in your breasts.  Anything that concerns you.   Steps to Quit Smoking Smoking tobacco is the leading cause of preventable death. It can affect almost every organ in the body. Smoking puts you and those around you at risk for developing many serious chronic  diseases. Quitting smoking can be difficult, but it is one of the best things that you can do for your health. It is never too late to quit. How do I get ready to quit? When you decide to quit smoking, create a plan to help you succeed. Before you quit:  Pick a date to quit. Set a date within the next 2 weeks to give you time to prepare.  Write down the reasons why you are quitting. Keep this list in places where you will see it often.  Tell your family, friends, and co-workers that you are quitting. Support from your loved ones can make quitting easier.  Talk with your health care provider about your options for quitting smoking.  Find out what treatment options are covered by your health insurance.  Identify people, places, things, and activities that make you want to smoke (triggers). Avoid them. What first steps can I take to quit smoking?  Throw away all cigarettes at home, at work, and in your car.  Throw away smoking accessories, such as Scientist, research (medical).  Clean your car. Make sure to empty the ashtray.  Clean your home, including curtains and carpets. What strategies can I use to quit smoking? Talk with your health care provider about combining strategies, such as taking medicines while you are also receiving in-person counseling. Using these two strategies together makes you more likely to succeed in quitting than if you used either strategy on its own.  If you are pregnant or breastfeeding, talk with your health care provider about finding counseling or other support strategies to quit smoking. Do not take medicine to help you quit smoking unless your health care provider tells you to do so. To quit smoking: Quit right away  Quit smoking completely, instead of gradually reducing how much you smoke over a period of time. Research shows that stopping smoking right away is more successful than gradually quitting.  Attend in-person counseling to help you build  problem-solving skills. You are more likely to succeed in quitting if you attend counseling sessions regularly. Even short sessions of 10 minutes can be effective. Take medicine You may take medicines to help you quit smoking. Some medicines require a prescription and some you can purchase over-the-counter. Medicines may have nicotine in them to replace the  nicotine in cigarettes. Medicines may:  Help to stop cravings.  Help to relieve withdrawal symptoms. Your health care provider may recommend:  Nicotine patches, gum, or lozenges.  Nicotine inhalers or sprays.  Non-nicotine medicine that is taken by mouth. Find resources Find resources and support systems that can help you to quit smoking and remain smoke-free after you quit. These resources are most helpful when you use them often. They include:  Online chats with a Social worker.  Telephone quitlines.  Printed Furniture conservator/restorer.  Support groups or group counseling.  Text messaging programs.  Mobile phone apps or applications. Use apps that can help you stick to your quit plan by providing reminders, tips, and encouragement. There are many free apps for mobile devices as well as websites. Examples include Quit Guide from the [employer] and smokefree.gov What things can I do to make it easier to quit?   Reach out to your family and friends for support and encouragement. Call telephone quitlines (1-800-QUIT-NOW), reach out to support groups, or work with a counselor for support.  Ask people who smoke to avoid smoking around you.  Avoid places that trigger you to smoke, such as bars, parties, or smoke-break areas at work.  Spend time with people who do not smoke.  Lessen the stress in your life. Stress can be a smoking trigger for some people. To lessen stress, try: ? Exercising regularly. ? Doing deep-breathing exercises. ? Doing yoga. ? Meditating. ? Performing a body scan. This involves closing your eyes, scanning your body from  head to toe, and noticing which parts of your body are particularly tense. Try to relax the muscles in those areas. How will I feel when I quit smoking? Day 1 to 3 weeks Within the first 24 hours of quitting smoking, you may start to feel withdrawal symptoms. These symptoms are usually most noticeable 2-3 days after quitting, but they usually do not last for more than 2-3 weeks. You may experience these symptoms:  Mood swings.  Restlessness, anxiety, or irritability.  Trouble concentrating.  Dizziness.  Strong cravings for sugary foods and nicotine.  Mild weight gain.  Constipation.  Nausea.  Coughing or a sore throat.  Changes in how the medicines that you take for unrelated issues work in your body.  Depression.  Trouble sleeping (insomnia). Week 3 and afterward After the first 2-3 weeks of quitting, you may start to notice more positive results, such as:  Improved sense of smell and taste.  Decreased coughing and sore throat.  Slower heart rate.  Lower blood pressure.  Clearer skin.  The ability to breathe more easily.  Fewer sick days. Quitting smoking can be very challenging. Do not get discouraged if you are not successful the first time. Some people need to make many attempts to quit before they achieve long-term success. Do your best to stick to your quit plan, and talk with your health care provider if you have any questions or concerns. Summary  Smoking tobacco is the leading cause of preventable death. Quitting smoking is one of the best things that you can do for your health.  When you decide to quit smoking, create a plan to help you succeed.  Quit smoking right away, not slowly over a period of time.  When you start quitting, seek help from your health care provider, family, or friends. This information is not intended to replace advice given to you by your health care provider. Make sure you discuss any questions you have with your health  care  provider. Document Revised: 12/29/2018 Document Reviewed: 06/24/2018 Elsevier Patient Education  [place].

## 2020-02-26 ENCOUNTER — Other Ambulatory Visit: Payer: Self-pay | Admitting: Internal Medicine

## 2020-04-03 ENCOUNTER — Other Ambulatory Visit: Payer: Self-pay

## 2020-04-03 ENCOUNTER — Encounter: Payer: Self-pay | Admitting: Endocrinology

## 2020-04-03 ENCOUNTER — Ambulatory Visit (INDEPENDENT_AMBULATORY_CARE_PROVIDER_SITE_OTHER): Payer: 59 | Admitting: Endocrinology

## 2020-04-03 VITALS — BP 124/86 | HR 86 | Ht 63.0 in | Wt 145.0 lb

## 2020-04-03 DIAGNOSIS — E042 Nontoxic multinodular goiter: Secondary | ICD-10-CM | POA: Diagnosis not present

## 2020-04-03 LAB — TSH: TSH: 1.13 u[IU]/mL (ref 0.35–4.50)

## 2020-04-03 LAB — T4, FREE: Free T4: 0.73 ng/dL (ref 0.60–1.60)

## 2020-04-03 NOTE — Progress Notes (Signed)
Subjective:    Patient ID: Whitney Patton, female    DOB: 12-21-1965, 54 y.o.   MRN: [id number]  HPI Pt returns for f/u of Multinodular goiter (dx'ed 2001; [place] in 2018 showed: recommend FNA mid right nodule: 4.9 x 2.1 x 2.8 cm, compared to 3.1 x 1.4 x 1.8 cm (2001); bxs 2018 and 2021 both showed BENIGN FOLLICULAR NODULE (Beth cat 2); TFT are normal off rx).  pt states she feels well in general.  She does not notice the goiter.    Past Medical History:  Diagnosis Date  . Allergy   . Hypertension   . Multinodular goiter   . Prediabetes 02/14/2019    No past surgical history on file.  Social History   Socioeconomic History  . Marital status: Married    Spouse name: Not on file  . Number of children: Not on file  . Years of education: Not on file  . Highest education level: Not on file  Occupational History  . Not on file  Tobacco Use  . Smoking status: Current Every Day Smoker    Types: Cigarettes  . Smokeless tobacco: Never Used  Vaping Use  . Vaping Use: Never used  Substance and Sexual Activity  . Alcohol use: Yes    Alcohol/week: 7.0 standard drinks    Types: 7 Glasses of wine per week  . Drug use: No  . Sexual activity: Yes    Partners: Male    Birth control/protection: Post-menopausal  Other Topics Concern  . Not on file  Social History Narrative  . Not on file   Social Determinants of Health   Financial Resource Strain: Not on file  Food Insecurity: Not on file  Transportation Needs: Not on file  Physical Activity: Not on file  Stress: Not on file  Social Connections: Not on file  Intimate Partner Violence: Not on file    Current Outpatient Medications on File Prior to Visit  Medication Sig Dispense Refill  . amLODipine (NORVASC) 5 MG tablet TAKE 1 TABLET (5 MG TOTAL) BY MOUTH DAILY. PATIENT NEEDS OFFICE VISIT BEFORE REFILLS WILL BE GIVEN 90 tablet 3  . Calcium Carbonate-Vit D-Min (CALTRATE 600+D PLUS PO) Take by mouth.    . Dermatological Products,  Misc. (NONYX) GEL Apply topically.    . halobetasol (ULTRAVATE) 0.05 % cream APPLY TOPICALLY 2 (TWO) TIMES DAILY. 50 g 1  . irbesartan-hydrochlorothiazide (AVALIDE) 300-12.5 MG tablet TAKE 1 TABLET BY MOUTH EVERY DAY 90 tablet 1  . Multiple Vitamin (MULTIVITAMIN PO) Take by mouth.    . vitamin C (ASCORBIC ACID) 500 MG tablet Take 500 mg by mouth daily.     Current Facility-Administered Medications on File Prior to Visit  Medication Dose Route Frequency Provider Last Rate Last Admin  . 0.9 %  sodium chloride infusion  500 mL Intravenous Continuous Irene Shipper, MD        No Known Allergies  Family History  Problem Relation Age of Onset  . Lung cancer Mother   . Diabetes Father   . Diabetes Brother   . Colon cancer Neg Hx   . Colon polyps Neg Hx   . Esophageal cancer Neg Hx   . Rectal cancer Neg Hx   . Stomach cancer Neg Hx   . Thyroid disease Neg Hx     BP 124/86   Pulse 86   Ht 5\' 3"  (1.6 m)   Wt 145 lb (65.8 kg)   LMP 07/18/2017 (Approximate)   SpO2 98%  BMI 25.69 kg/m    Review of Systems     Objective:   Physical Exam VITAL SIGNS:  See vs page GENERAL: no distress NECK: There is no palpable thyroid enlargement.  No thyroid nodule is palpable.  No palpable lymphadenopathy at the anterior neck.    Lab Results  Component Value Date   TSH 1.13 04/03/2020   T4TOTAL 5.9 01/19/2017      Assessment & Plan:  MNG, euthyroid.  Due for recheck  Patient Instructions  Let's recheck the ultrasound.  you will receive a phone call, about a day and time for an appointment. Blood tests are requested for you today.  We'll let you know about the results.  Please come back for a follow-up appointment in 1 year.

## 2020-04-03 NOTE — Patient Instructions (Signed)
Let's recheck the ultrasound.  you will receive a phone call, about a day and time for an appointment. Blood tests are requested for you today.  We'll let you know about the results.   Please come back for a follow-up appointment in 1 year.   

## 2020-06-06 ENCOUNTER — Ambulatory Visit: Payer: Self-pay | Admitting: Endocrinology

## 2020-06-26 ENCOUNTER — Other Ambulatory Visit: Payer: Self-pay | Admitting: Obstetrics and Gynecology

## 2020-06-26 DIAGNOSIS — Z1231 Encounter for screening mammogram for malignant neoplasm of breast: Secondary | ICD-10-CM

## 2020-06-27 ENCOUNTER — Ambulatory Visit (HOSPITAL_BASED_OUTPATIENT_CLINIC_OR_DEPARTMENT_OTHER)
Admission: RE | Admit: 2020-06-27 | Discharge: 2020-06-27 | Disposition: A | Discharge status: Home / Self Care | Payer: 59 | Source: Ambulatory Visit | Attending: Endocrinology | Admitting: Endocrinology

## 2020-06-27 ENCOUNTER — Other Ambulatory Visit: Payer: Self-pay

## 2020-06-27 DIAGNOSIS — E042 Nontoxic multinodular goiter: Secondary | ICD-10-CM | POA: Diagnosis not present

## 2020-07-09 ENCOUNTER — Ambulatory Visit
Admission: RE | Admit: 2020-07-09 | Discharge: 2020-07-09 | Disposition: A | Discharge status: Home / Self Care | Payer: 59 | Source: Ambulatory Visit | Attending: Obstetrics and Gynecology | Admitting: Obstetrics and Gynecology

## 2020-07-09 ENCOUNTER — Other Ambulatory Visit: Payer: Self-pay

## 2020-07-09 DIAGNOSIS — Z1231 Encounter for screening mammogram for malignant neoplasm of breast: Secondary | ICD-10-CM

## 2021-02-25 ENCOUNTER — Ambulatory Visit: Payer: 59 | Admitting: Obstetrics and Gynecology

## 2021-04-03 ENCOUNTER — Ambulatory Visit (INDEPENDENT_AMBULATORY_CARE_PROVIDER_SITE_OTHER): Payer: 59 | Admitting: Endocrinology

## 2021-04-03 ENCOUNTER — Other Ambulatory Visit: Payer: Self-pay

## 2021-04-03 ENCOUNTER — Encounter: Payer: Self-pay | Admitting: Endocrinology

## 2021-04-03 VITALS — BP 112/70 | HR 105 | Ht 63.0 in | Wt 145.6 lb

## 2021-04-03 DIAGNOSIS — E042 Nontoxic multinodular goiter: Secondary | ICD-10-CM | POA: Diagnosis not present

## 2021-04-03 LAB — T4, FREE: Free T4: 1 ng/dL (ref 0.8–1.8)

## 2021-04-03 LAB — TSH: TSH: 0.63 mIU/L (ref 0.40–4.50)

## 2021-04-03 NOTE — Progress Notes (Signed)
Subjective:    Patient ID: Whitney Patton, female    DOB: [birth year], 55 y.o.   MRN: [id number]  HPI Pt returns for f/u of Multinodular goiter (dx'ed 2001; [place] in 2018 showed: recommend FNA mid right nodule: 4.9 x 2.1 x 2.8 cm, compared to 3.1 x 1.4 x 1.8 cm (2001); bxs 2018 and 2021 both showed BENIGN FOLLICULAR NODULE (Beth cat 2); she is euthyroid; 2022 [place] advised 1 year f/u).  pt states she feels well in general.  She does not notice the goiter.   Past Medical History:  Diagnosis Date   Allergy    Hypertension    Multinodular goiter    Prediabetes 02/14/2019    Past Surgical History:  Procedure Laterality Date   BREAST BIOPSY Left 05/24/2019   FIBROCYSTIC CHANGES INCLUDING APOCRINE    Social History   Socioeconomic History   Marital status: Married    Spouse name: Not on file   Number of children: Not on file   Years of education: Not on file   Highest education level: Not on file  Occupational History   Not on file  Tobacco Use   Smoking status: Every Day    Types: Cigarettes   Smokeless tobacco: Never  Vaping Use   Vaping Use: Never used  Substance and Sexual Activity   Alcohol use: Yes    Alcohol/week: 7.0 standard drinks    Types: 7 Glasses of wine per week   Drug use: No   Sexual activity: Yes    Partners: Male    Birth control/protection: Post-menopausal  Other Topics Concern   Not on file  Social History Narrative   Not on file   Social Determinants of Health   Financial Resource Strain: Not on file  Food Insecurity: Not on file  Transportation Needs: Not on file  Physical Activity: Not on file  Stress: Not on file  Social Connections: Not on file  Intimate Partner Violence: Not on file    Current Outpatient Medications on File Prior to Visit  Medication Sig Dispense Refill   amLODipine (NORVASC) 5 MG tablet TAKE 1 TABLET (5 MG TOTAL) BY MOUTH DAILY. PATIENT NEEDS OFFICE VISIT BEFORE REFILLS WILL BE GIVEN 90 tablet 3   Calcium Carbonate-Vit  D-Min (CALTRATE 600+D PLUS PO) Take by mouth.     Dermatological Products, Misc. (NONYX) GEL Apply topically.     halobetasol (ULTRAVATE) 0.05 % cream APPLY TOPICALLY 2 (TWO) TIMES DAILY. 50 g 1   irbesartan-hydrochlorothiazide (AVALIDE) 300-12.5 MG tablet TAKE 1 TABLET BY MOUTH EVERY DAY 90 tablet 1   Multiple Vitamin (MULTIVITAMIN PO) Take by mouth.     vitamin C (ASCORBIC ACID) 500 MG tablet Take 500 mg by mouth daily.     Current Facility-Administered Medications on File Prior to Visit  Medication Dose Route Frequency Provider Last Rate Last Admin   0.9 %  sodium chloride infusion  500 mL Intravenous Continuous Irene Shipper, MD        No Known Allergies  Family History  Problem Relation Age of Onset   Lung cancer Mother    Diabetes Father    Diabetes Brother    Colon cancer Neg Hx    Colon polyps Neg Hx    Esophageal cancer Neg Hx    Rectal cancer Neg Hx    Stomach cancer Neg Hx    Thyroid disease Neg Hx     BP 112/70 (BP Location: Right Arm, Patient Position: Sitting, Cuff Size: Normal)  Pulse (!) 105    Ht 5\' 3"  (1.6 m)    Wt 145 lb 9.6 oz (66 kg)    LMP 07/18/2017 (Approximate)    SpO2 96%    BMI 25.79 kg/m    Review of Systems     Objective:   Physical Exam NECK:  4 cm right thyroid nodule is noted   Lab Results  Component Value Date   TSH 0.63 04/03/2021   T4TOTAL 5.9 01/19/2017      Assessment & Plan:  MNG: due for recheck soon.  No medication is needed  Patient Instructions  Let's recheck the ultrasound.  you will receive a phone call, about a day and time for an appointment.   Blood tests are requested for you today.  We'll let you know about the results.  Please come back for a follow-up appointment in 1 year.

## 2021-04-03 NOTE — Patient Instructions (Addendum)
Let's recheck the ultrasound.  you will receive a phone call, about a day and time for an appointment.   Blood tests are requested for you today.  We'll let you know about the results.  Please come back for a follow-up appointment in 1 year.

## 2021-04-06 NOTE — Progress Notes (Signed)
55 y.o. G32P1011 Married Black or [place] American [name] here for annual exam.  She had a slight amount of spotting when she wiped this week after voiding. Hadn't been sexually active prior to the bleeding. No pain. No dyspareunia.     Patient's last menstrual period was 07/18/2017 (approximate).          Sexually active: Yes.    The current method of family planning is none.    Exercising: Yes.    Home exercise routine includes  . Smoker:  yes, 1 pack per week  Health Maintenance: Pap:  01-14-16 normal History of abnormal Pap:  yes many yrs ago MMG:  07-09-20 normal BMD:   never Colonoscopy: 05/07/2016 polyp, she will f/u with primary to schedule.  TDaP:  2017 Gardasil: no   reports that she has been smoking cigarettes. She has never used smokeless tobacco. She reports current alcohol use of about 7.0 standard drinks per week. She reports that she does not use drugs.She works as a Probation officer. [place] daughter and 3 grand sons. She has taken on one of her grandsons 2 siblings as additional grandsons. Boys range in age from 81 to 30.  Past Medical History:  Diagnosis Date   Allergy    Hypertension    Multinodular goiter    Prediabetes 02/14/2019    Past Surgical History:  Procedure Laterality Date   BREAST BIOPSY Left 05/24/2019   FIBROCYSTIC CHANGES INCLUDING APOCRINE    Current Outpatient Medications  Medication Sig Dispense Refill   amLODipine (NORVASC) 5 MG tablet TAKE 1 TABLET (5 MG TOTAL) BY MOUTH DAILY. PATIENT NEEDS OFFICE VISIT BEFORE REFILLS WILL BE GIVEN 90 tablet 3   Calcium Carbonate-Vit D-Min (CALTRATE 600+D PLUS PO) Take by mouth.     halobetasol (ULTRAVATE) 0.05 % cream Apply topically 2 (two) times daily.     irbesartan-hydrochlorothiazide (AVALIDE) 300-12.5 MG tablet TAKE 1 TABLET BY MOUTH EVERY DAY 90 tablet 1   Multiple Vitamin (MULTIVITAMIN PO) Take by mouth.     Dermatological Products, Misc. (NONYX) GEL Apply topically. (Patient not  taking: Reported on 04/10/2021)     Current Facility-Administered Medications  Medication Dose Route Frequency Provider Last Rate Last Admin   0.9 %  sodium chloride infusion  500 mL Intravenous Continuous Irene Shipper, MD        Family History  Problem Relation Age of Onset   Lung cancer Mother    Diabetes Father    Diabetes Brother    Colon cancer Neg Hx    Colon polyps Neg Hx    Esophageal cancer Neg Hx    Rectal cancer Neg Hx    Stomach cancer Neg Hx    Thyroid disease Neg Hx     Review of Systems  All other systems reviewed and are negative.  Exam:   BP 132/82 (BP Location: Right Arm, Patient Position: Sitting, Cuff Size: Normal)    Pulse 84    Ht 5\' 3"  (1.6 m)    Wt 140 lb (63.5 kg)    LMP 07/18/2017 (Approximate)    SpO2 95%    BMI 24.80 kg/m   Weight change: @WEIGHTCHANGE @ Height:   Height: 5\' 3"  (160 cm)  Ht Readings from Last 3 Encounters:  04/10/21 5\' 3"  (1.6 m)  04/03/21 5\' 3"  (1.6 m)  04/03/20 5\' 3"  (1.6 m)    General appearance: alert, cooperative and appears stated age Head: Normocephalic, without obvious abnormality, atraumatic Neck: no adenopathy, supple, symmetrical, trachea  midline and thyroid  enlarged and irregular Lungs: clear to auscultation bilaterally Cardiovascular: regular rate and rhythm Breasts: normal appearance, no masses or tenderness Abdomen: soft, non-tender; non distended,  no masses,  no organomegaly Extremities: extremities normal, atraumatic, no cyanosis or edema Skin: Skin color, texture, turgor normal. No rashes or lesions Lymph nodes: Cervical, supraclavicular, and axillary nodes normal. No abnormal inguinal nodes palpated Neurologic: Grossly normal   Pelvic: External genitalia:  no lesions              Urethra:  normal appearing urethra with no masses, tenderness or lesions              Bartholins and Skenes: normal                 Vagina: normal appearing vagina with an increase in watery vaginal discharge.               Cervix: no lesions               Bimanual Exam:  Uterus:  normal size, contour, position, consistency, mobility, non-tender              Adnexa: no mass, fullness, tenderness               Rectovaginal: Confirms               Anus:  normal sphincter tone, no lesions  Caryn Bee, CMA chaperoned for the exam.   On questioning the patient has noticed an increase in vaginal discharge for the last week. No other symptoms.   1. Well woman exam Discussed breast self exam Discussed calcium and vit D intake Mammogram in 3/23 Colonoscopy UTD  2. Screening for cervical cancer - Cytology - PAP  3. Postmenopausal bleeding - US PELVIS TRANSVAGINAL NON-OB (TV ONLY); Future  4. Vaginal discharge - WET PREP FOR [place], YEAST, CLUE  5. Screening examination for STD (sexually transmitted disease) - Cytology - PAP - RPR - HIV Antibody (routine testing w rflx) - Hepatitis C antibody  6. Trichomonal vaginitis - metroNIDAZOLE (FLAGYL) 500 MG tablet; Take 1 tablet (500 mg total) by mouth 2 (two) times daily.  Dispense: 14 tablet; Refill: 0  7. Bacterial vaginitis - metroNIDAZOLE (FLAGYL) 500 MG tablet; Take 1 tablet (500 mg total) by mouth 2 (two) times daily.  Dispense: 14 tablet; Refill: 0

## 2021-04-10 ENCOUNTER — Encounter: Payer: Self-pay | Admitting: Obstetrics and Gynecology

## 2021-04-10 ENCOUNTER — Other Ambulatory Visit: Payer: Self-pay

## 2021-04-10 ENCOUNTER — Ambulatory Visit (INDEPENDENT_AMBULATORY_CARE_PROVIDER_SITE_OTHER): Payer: 59 | Admitting: Obstetrics and Gynecology

## 2021-04-10 ENCOUNTER — Other Ambulatory Visit (HOSPITAL_COMMUNITY)
Admission: RE | Admit: 2021-04-10 | Discharge: 2021-04-10 | Disposition: A | Discharge status: Home / Self Care | Payer: 59 | Source: Ambulatory Visit | Attending: Obstetrics and Gynecology | Admitting: Obstetrics and Gynecology

## 2021-04-10 VITALS — BP 132/82 | HR 84 | Ht 63.0 in | Wt 140.0 lb

## 2021-04-10 DIAGNOSIS — Z124 Encounter for screening for malignant neoplasm of cervix: Secondary | ICD-10-CM

## 2021-04-10 DIAGNOSIS — A5901 Trichomonal vulvovaginitis: Secondary | ICD-10-CM

## 2021-04-10 DIAGNOSIS — N760 Acute vaginitis: Secondary | ICD-10-CM

## 2021-04-10 DIAGNOSIS — N950 Postmenopausal bleeding: Secondary | ICD-10-CM | POA: Diagnosis not present

## 2021-04-10 DIAGNOSIS — E010 Iodine-deficiency related diffuse (endemic) goiter: Secondary | ICD-10-CM | POA: Insufficient documentation

## 2021-04-10 DIAGNOSIS — G44219 Episodic tension-type headache, not intractable: Secondary | ICD-10-CM | POA: Insufficient documentation

## 2021-04-10 DIAGNOSIS — B9689 Other specified bacterial agents as the cause of diseases classified elsewhere: Secondary | ICD-10-CM

## 2021-04-10 DIAGNOSIS — Z113 Encounter for screening for infections with a predominantly sexual mode of transmission: Secondary | ICD-10-CM

## 2021-04-10 DIAGNOSIS — N898 Other specified noninflammatory disorders of vagina: Secondary | ICD-10-CM | POA: Diagnosis not present

## 2021-04-10 DIAGNOSIS — Z01419 Encounter for gynecological examination (general) (routine) without abnormal findings: Secondary | ICD-10-CM

## 2021-04-10 LAB — WET PREP FOR TRICH, YEAST, CLUE

## 2021-04-10 MED ORDER — METRONIDAZOLE 500 MG PO TABS: 500.0000 mg | ORAL_TABLET | Freq: Two times a day (BID) | ORAL | 0 refills | Status: DC

## 2021-04-10 NOTE — Patient Instructions (Addendum)
EXERCISE   We recommended that you start or continue a regular exercise program for good health. Physical activity is anything that gets your body moving, some is better than none. The CDC recommends 150 minutes per week of Moderate-Intensity Aerobic Activity and 2 or more days of Muscle Strengthening Activity.  Benefits of exercise are limitless: helps weight loss/weight maintenance, improves mood and energy, helps with depression and anxiety, improves sleep, tones and strengthens muscles, improves balance, improves bone density, protects from chronic conditions such as heart disease, high blood pressure and diabetes and so much more. To learn more visit: https://www.cdc.gov/physicalactivity/index.html  DIET: Good nutrition starts with a healthy diet of fruits, vegetables, whole grains, and lean protein sources. Drink plenty of water for hydration. Minimize empty calories, sodium, sweets. For more information about dietary recommendations visit: https://health.gov/our-work/nutrition-physical-activity/dietary-guidelines and https://www.myplate.gov/  ALCOHOL:  Women should limit their alcohol intake to no more than 7 drinks/beers/glasses of wine (combined, not each!) per week. Moderation of alcohol intake to this level decreases your risk of breast cancer and liver damage.  If you are concerned that you may have a problem, or your friends have told you they are concerned about your drinking, there are many resources to help. A well-known program that is free, effective, and available to all people all over the nation is Alcoholics Anonymous.  Check out this site to learn more: https://www.aa.org/   CALCIUM AND VITAMIN D:  Adequate intake of calcium and Vitamin D are recommended for bone health.  You should be getting between 1000-1200 mg of calcium and 800 units of Vitamin D daily between diet and supplements  PAP SMEARS:  Pap smears, to check for cervical cancer or precancers,  have traditionally been  done yearly, scientific advances have shown that most women can have pap smears less often.  However, every woman still should have a physical exam from her gynecologist every year. It will include a breast check, inspection of the vulva and vagina to check for abnormal growths or skin changes, a visual exam of the cervix, and then an exam to evaluate the size and shape of the uterus and ovaries. We will also provide age appropriate advice regarding health maintenance, like when you should have certain vaccines, screening for sexually transmitted diseases, bone density testing, colonoscopy, mammograms, etc.   MAMMOGRAMS:  All women over 40 years old should have a routine mammogram.   COLON CANCER SCREENING: Now recommend starting at age 45. At this time colonoscopy is not covered for routine screening until 50. There are take home tests that can be done between 45-49.   COLONOSCOPY:  Colonoscopy to screen for colon cancer is recommended for all women at age 50.  We know, you hate the idea of the prep.  We agree, BUT, having colon cancer and not knowing it is worse!!  Colon cancer so often starts as a polyp that can be seen and removed at colonscopy, which can quite literally save your life!  And if your first colonoscopy is normal and you have no family history of colon cancer, most women don't have to have it again for 10 years.  Once every ten years, you can do something that may end up saving your life, right?  We will be happy to help you get it scheduled when you are ready.  Be sure to check your insurance coverage so you understand how much it will cost.  It may be covered as a preventative service at no cost, but you should check   your particular policy.      Breast Self-Awareness Breast self-awareness means being familiar with how your breasts look and feel. It involves checking your breasts regularly and reporting any changes to your health care provider. Practicing breast self-awareness is  important. A change in your breasts can be a sign of a serious medical problem. Being familiar with how your breasts look and feel allows you to find any problems early, when treatment is more likely to be successful. All women should practice breast self-awareness, including women who have had breast implants. How to do a breast self-exam One way to learn what is normal for your breasts and whether your breasts are changing is to do a breast self-exam. To do a breast self-exam: Look for Changes  Remove all the clothing above your waist. Stand in front of a mirror in a room with good lighting. Put your hands on your hips. Push your hands firmly downward. Compare your breasts in the mirror. Look for differences between them (asymmetry), such as: Differences in shape. Differences in size. Puckers, dips, and bumps in one breast and not the other. Look at each breast for changes in your skin, such as: Redness. Scaly areas. Look for changes in your nipples, such as: Discharge. Bleeding. Dimpling. Redness. A change in position. Feel for Changes Carefully feel your breasts for lumps and changes. It is best to do this while lying on your back on the floor and again while sitting or standing in the shower or tub with soapy water on your skin. Feel each breast in the following way: Place the arm on the side of the breast you are examining above your head. Feel your breast with the other hand. Start in the nipple area and make  inch (2 cm) overlapping circles to feel your breast. Use the pads of your three middle fingers to do this. Apply light pressure, then medium pressure, then firm pressure. The light pressure will allow you to feel the tissue closest to the skin. The medium pressure will allow you to feel the tissue that is a little deeper. The firm pressure will allow you to feel the tissue close to the ribs. Continue the overlapping circles, moving downward over the breast until you feel your  ribs below your breast. Move one finger-width toward the center of the body. Continue to use the  inch (2 cm) overlapping circles to feel your breast as you move slowly up toward your collarbone. Continue the up and down exam using all three pressures until you reach your armpit.  Write Down What You Find  Write down what is normal for each breast and any changes that you find. Keep a written record with breast changes or normal findings for each breast. By writing this information down, you do not need to depend only on memory for size, tenderness, or location. Write down where you are in your menstrual cycle, if you are still menstruating. If you are having trouble noticing differences in your breasts, do not get discouraged. With time you will become more familiar with the variations in your breasts and more comfortable with the exam. How often should I examine my breasts? Examine your breasts every month. If you are breastfeeding, the best time to examine your breasts is after a feeding or after using a breast pump. If you menstruate, the best time to examine your breasts is 5-7 days after your period is over. During your period, your breasts are lumpier, and it may be more   difficult to notice changes. When should I see my health care provider? See your health care provider if you notice: A change in shape or size of your breasts or nipples. A change in the skin of your breast or nipples, such as a reddened or scaly area. Unusual discharge from your nipples. A lump or thick area that was not there before. Pain in your breasts. Anything that concerns you. Trichomoniasis Trichomoniasis is an STI (sexually transmitted infection) that can affect both women and men. In women, the outer area of the female genitalia (vulva) and the vagina are affected. In men, mainly the penis is affected, but the prostate and other reproductive organs can also be involved.  This condition can be treated with  medicine. It often has no symptoms (is asymptomatic), especially in men. If not treated, trichomoniasis can last for months or years. What are the causes? This condition is caused by a parasite called Trichomonas vaginalis. Trichomoniasis most often spreads from person to person (is contagious) through sexual contact. What increases the risk? The following factors may make you more likely to develop this condition: Having unprotected sex. Having sex with a partner who has trichomoniasis. Having multiple sexual partners. Having had previous trichomoniasis infections or other STIs. What are the signs or symptoms? In women, symptoms of trichomoniasis include: Abnormal vaginal discharge that is clear, white, gray, or yellow-green and foamy and has an unusual "fishy" odor. Itching and irritation of the vagina and vulva. Burning or pain during urination or sex. Redness and swelling of the genitals. In men, symptoms of trichomoniasis include: Penile discharge that may be foamy or contain pus. Pain in the penis. This may happen only when urinating. Itching or irritation inside the penis. Burning after urination or ejaculation. How is this diagnosed? In women, this condition may be found during a routine Pap test or physical exam. It may be found in men during a routine physical exam. Your health care provider may do tests to help diagnose this infection, such as: Urine tests (men and women). The following in women: Testing the pH of the vagina. A vaginal swab test that checks for the Trichomonas vaginalis parasite. Testing vaginal secretions. Your health care provider may test you for other STIs, including HIV (human immunodeficiency virus). How is this treated? This condition is treated with medicine taken by mouth (orally), such as metronidazole or tinidazole, to fight the infection. Your sexual partner(s) also need to be tested and treated. If you are a woman and you plan to become pregnant  or think you may be pregnant, tell your health care provider right away. Some medicines that are used to treat the infection should not be taken during pregnancy. Your health care provider may recommend over-the-counter medicines or creams to help relieve itching or irritation. You may be tested for infection again 3 months after treatment. Follow these instructions at home: Take and use over-the-counter and prescription medicines, including creams, only as told by your health care provider. Take your antibiotic medicine as told by your health care provider. Do not stop taking the antibiotic even if you start to feel better. Do not have sex until 7-10 days after you finish your medicine, or until your health care provider approves. Ask your health care provider when you may start to have sex again. (Women) Do not douche or wear tampons while you have the infection. Discuss your infection with your sexual partner(s). Make sure that your partner gets tested and treated, if necessary. Keep all follow-up  visits as told by your health care provider. This is important. How is this prevented?  Use condoms every time you have sex. Using condoms correctly and consistently can help protect against STIs. Avoid having multiple sexual partners. Talk with your sexual partner about any symptoms that either of you may have, as well as any history of STIs. Get tested for STIs and STDs (sexually transmitted diseases) before you have sex. Ask your partner to do the same. Do not have sexual contact if you have symptoms of trichomoniasis or another STI. Contact a health care provider if: You still have symptoms after you finish your medicine. You develop pain in your abdomen. You have pain when you urinate. You have bleeding after sex. You develop a rash. You feel nauseous or you vomit. You plan to become pregnant or think you may be pregnant. Summary Trichomoniasis is an STI (sexually transmitted infection)  that can affect both women and men. This condition often has no symptoms (is asymptomatic), especially in men. Without treatment, this condition can last for months or years. You should not have sex until 7-10 days after you finish your medicine, or until your health care provider approves. Ask your health care provider when you may start to have sex again. Discuss your infection with your sexual partner(s). Make sure that your partner gets tested and treated, if necessary. This information is not intended to replace advice given to you by your health care provider. Make sure you discuss any questions you have with your health care provider. Document Revised: 01/17/2018 Document Reviewed: 01/17/2018 Elsevier Patient Education  2022 [employer].

## 2021-04-14 ENCOUNTER — Other Ambulatory Visit: Payer: Self-pay | Admitting: *Deleted

## 2021-04-14 DIAGNOSIS — N950 Postmenopausal bleeding: Secondary | ICD-10-CM

## 2021-04-14 LAB — HIV ANTIBODY (ROUTINE TESTING W REFLEX): HIV 1&2 Ab, 4th Generation: NONREACTIVE

## 2021-04-14 LAB — HEPATITIS C ANTIBODY
Hepatitis C Ab: NONREACTIVE
SIGNAL TO CUT-OFF: 0.08 (ref <1.00)

## 2021-04-14 LAB — RPR: RPR Ser Ql: NONREACTIVE

## 2021-04-15 ENCOUNTER — Telehealth: Payer: Self-pay

## 2021-04-15 LAB — CYTOLOGY - PAP
Chlamydia: NEGATIVE
Comment: NEGATIVE
Comment: NEGATIVE
Comment: NEGATIVE
Comment: NORMAL
Diagnosis: NEGATIVE
High risk HPV: NEGATIVE
Neisseria Gonorrhea: NEGATIVE
Trichomonas: POSITIVE — AB

## 2021-04-15 NOTE — Telephone Encounter (Signed)
Patient called because at her OV she was diagnosed with trichomonas infection and was told it was sexually transmitted but she states has only been sexually active with her husband. SHe is having a difficult time accepting that her husband is responsible for her having this infection.  I did confirm that this is considered a STI.    She said she will have a question for Dr. Talbert Nan once all her test results return.

## 2021-05-11 ENCOUNTER — Other Ambulatory Visit: Payer: Self-pay

## 2021-05-11 ENCOUNTER — Ambulatory Visit (INDEPENDENT_AMBULATORY_CARE_PROVIDER_SITE_OTHER): Payer: 59 | Admitting: Obstetrics and Gynecology

## 2021-05-11 ENCOUNTER — Encounter: Payer: Self-pay | Admitting: Obstetrics and Gynecology

## 2021-05-11 VITALS — BP 122/84 | HR 67 | Ht 63.0 in | Wt 142.0 lb

## 2021-05-11 DIAGNOSIS — Z8619 Personal history of other infectious and parasitic diseases: Secondary | ICD-10-CM

## 2021-05-11 NOTE — Progress Notes (Signed)
GYNECOLOGY  VISIT   HPI: 56 y.o.   Married Black or [place] American Not Hispanic or Latino  female   [phone] with Patient's last menstrual period was 07/18/2017 (approximate).   here for one month follow up for Trichomonas. Her husband wasn't treated, said his testing was negative, denies having any other partners. She hasn't had any other partners. They haven't been sexually active since she was treated.   GYNECOLOGIC HISTORY: Patient's last menstrual period was 07/18/2017 (approximate). 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   Prediabetes 02/14/2019   Carpal tunnel syndrome, bilateral 06/29/2017   Laryngitis 06/08/2017   Multinodular goiter 02/04/2017   Leg hematoma, right, sequela 01/03/2017   Onychomycosis 12/01/2016   Cerumen impaction 12/01/2016   Vitamin D deficiency 12/01/2016   Dry mouth 06/02/2016   Hand eczema 03/24/2016   Acute upper respiratory infection 01/22/2016   Allergic urticaria 01/21/2016   Well adult exam 12/24/2015   Allergic rhinitis 12/24/2015   Intertriginous candidiasis 12/24/2015   Essential hypertension 12/24/2015    Past Medical History:  Diagnosis Date   Allergy    Hypertension    Multinodular goiter    Prediabetes 02/14/2019    Past Surgical History:  Procedure Laterality Date   BREAST BIOPSY Left 05/24/2019   FIBROCYSTIC CHANGES INCLUDING APOCRINE    Current Outpatient Medications  Medication Sig Dispense Refill   amLODipine (NORVASC) 5 MG tablet TAKE 1 TABLET (5 MG TOTAL) BY MOUTH DAILY. PATIENT NEEDS OFFICE VISIT BEFORE REFILLS WILL BE GIVEN 90 tablet 3   Calcium Carbonate-Vit D-Min (CALTRATE 600+D PLUS PO)  Take by mouth.     Dermatological Products, Misc. (NONYX) GEL Apply topically. (Patient not taking: Reported on 04/10/2021)     halobetasol (ULTRAVATE) 0.05 % cream Apply topically 2 (two) times daily.     irbesartan-hydrochlorothiazide (AVALIDE) 300-12.5 MG tablet TAKE 1 TABLET BY MOUTH EVERY DAY 90 tablet 1   metroNIDAZOLE (FLAGYL) 500 MG tablet Take 1 tablet (500 mg total) by mouth 2 (two) times daily. 14 tablet 0   Multiple Vitamin (MULTIVITAMIN PO) Take by mouth.     Current Facility-Administered Medications  Medication Dose Route Frequency Provider Last Rate Last Admin   0.9 %  sodium chloride infusion  500 mL Intravenous Continuous Irene Shipper, MD         ALLERGIES: Patient has no known allergies.  Family History  Problem Relation Age of Onset   Lung cancer Mother    Diabetes Father    Diabetes Brother    Colon cancer Neg Hx    Colon polyps Neg Hx    Esophageal cancer Neg Hx    Rectal cancer Neg Hx    Stomach cancer Neg Hx    Thyroid disease Neg Hx     Social History   Socioeconomic History   Marital status: Married    Spouse name: Not on file  Number of children: Not on file   Years of education: Not on file   Highest education level: Not on file  Occupational History   Not on file  Tobacco Use   Smoking status: Every Day    Types: Cigarettes   Smokeless tobacco: Never  Vaping Use   Vaping Use: Never used  Substance and Sexual Activity   Alcohol use: Yes    Alcohol/week: 7.0 standard drinks    Types: 7 Glasses of wine per week   Drug use: No   Sexual activity: Yes    Partners: Male    Birth control/protection: Post-menopausal  Other Topics Concern   Not on file  Social History Narrative   Not on file   Social Determinants of Health   Financial Resource Strain: Not on file  Food Insecurity: Not on file  Transportation Needs: Not on file  Physical Activity: Not on file  Stress: Not on file  Social Connections: Not on file  Intimate Partner  Violence: Not on file    Review of Systems  All other systems reviewed and are negative.  PHYSICAL EXAMINATION:    LMP 07/18/2017 (Approximate)     General appearance: alert, cooperative and appears stated age   Pelvic: External genitalia:  no lesions              Urethra:  normal appearing urethra with no masses, tenderness or lesions              Bartholins and Skenes: normal                 Vagina: normal appearing vagina with a slight increase in watery, white, frothy vaginal d/c (no symptoms)              Cervix: no lesions               Chaperone was present for exam.  1. History of trichomonal vaginitis - SURESWAB CT/NG/T. vaginalis

## 2021-05-12 LAB — SURESWAB CT/NG/T. VAGINALIS
C. trachomatis RNA, TMA: NOT DETECTED
N. gonorrhoeae RNA, TMA: NOT DETECTED
Trichomonas vaginalis RNA: NOT DETECTED

## 2021-05-14 ENCOUNTER — Ambulatory Visit (INDEPENDENT_AMBULATORY_CARE_PROVIDER_SITE_OTHER): Payer: 59 | Admitting: Obstetrics and Gynecology

## 2021-05-14 ENCOUNTER — Encounter: Payer: Self-pay | Admitting: Obstetrics and Gynecology

## 2021-05-14 ENCOUNTER — Telehealth: Payer: Self-pay | Admitting: Obstetrics and Gynecology

## 2021-05-14 ENCOUNTER — Ambulatory Visit (INDEPENDENT_AMBULATORY_CARE_PROVIDER_SITE_OTHER): Payer: 59

## 2021-05-14 ENCOUNTER — Other Ambulatory Visit: Payer: Self-pay

## 2021-05-14 VITALS — BP 110/82 | HR 76 | Ht 62.0 in | Wt 142.0 lb

## 2021-05-14 DIAGNOSIS — Z8619 Personal history of other infectious and parasitic diseases: Secondary | ICD-10-CM

## 2021-05-14 DIAGNOSIS — R9389 Abnormal findings on diagnostic imaging of other specified body structures: Secondary | ICD-10-CM

## 2021-05-14 DIAGNOSIS — N950 Postmenopausal bleeding: Secondary | ICD-10-CM | POA: Diagnosis not present

## 2021-05-14 NOTE — Progress Notes (Signed)
GYNECOLOGY  VISIT   HPI: 56 y.o.   Married Black or [place] American Not Hispanic or Latino  female   [phone] with Patient's last menstrual period was 07/18/2017 (approximate).   here for evaluation of PMP spotting from last month.    Pap from 04/10/21 was WNL, negative HPV, negative GC/CT, + trich. She was treated for trich and had repeat testing for trich on 05/11/21 that was negative.   GYNECOLOGIC HISTORY: Patient's last menstrual period was 07/18/2017 (approximate). Contraception:PMP Menopausal hormone therapy: no        OB History     Gravida  2   Para  1   Term  1   Preterm      AB  1   Living  1      SAB  1   IAB      Ectopic      Multiple      Live Births  1              Patient Active Problem List   Diagnosis Date Noted   Episodic tension-type headache 04/10/2021   Thyromegaly 04/10/2021   Prediabetes 02/14/2019   Carpal tunnel syndrome, bilateral 06/29/2017   Laryngitis 06/08/2017   Multinodular goiter 02/04/2017   Leg hematoma, right, sequela 01/03/2017   Onychomycosis 12/01/2016   Cerumen impaction 12/01/2016   Vitamin D deficiency 12/01/2016   Dry mouth 06/02/2016   Hand eczema 03/24/2016   Acute upper respiratory infection 01/22/2016   Allergic urticaria 01/21/2016   Well adult exam 12/24/2015   Allergic rhinitis 12/24/2015   Intertriginous candidiasis 12/24/2015   Essential hypertension 12/24/2015    Past Medical History:  Diagnosis Date   Allergy    Hypertension    Multinodular goiter    Prediabetes 02/14/2019    Past Surgical History:  Procedure Laterality Date   BREAST BIOPSY Left 05/24/2019   FIBROCYSTIC CHANGES INCLUDING APOCRINE    Current Outpatient Medications  Medication Sig Dispense Refill   amLODipine (NORVASC) 5 MG tablet TAKE 1 TABLET (5 MG TOTAL) BY MOUTH DAILY. PATIENT NEEDS OFFICE VISIT BEFORE REFILLS WILL BE GIVEN 90 tablet 3   Calcium Carbonate-Vit D-Min (CALTRATE 600+D PLUS PO) Take by mouth.      Dermatological Products, Misc. (NONYX) GEL Apply topically.     halobetasol (ULTRAVATE) 0.05 % cream Apply topically 2 (two) times daily.     irbesartan-hydrochlorothiazide (AVALIDE) 300-12.5 MG tablet TAKE 1 TABLET BY MOUTH EVERY DAY 90 tablet 1   Multiple Vitamin (MULTIVITAMIN PO) Take by mouth.     Current Facility-Administered Medications  Medication Dose Route Frequency Provider Last Rate Last Admin   0.9 %  sodium chloride infusion  500 mL Intravenous Continuous Irene Shipper, MD         ALLERGIES: Patient has no known allergies.  Family History  Problem Relation Age of Onset   Lung cancer Mother    Diabetes Father    Diabetes Brother    Colon cancer Neg Hx    Colon polyps Neg Hx    Esophageal cancer Neg Hx    Rectal cancer Neg Hx    Stomach cancer Neg Hx    Thyroid disease Neg Hx     Social History   Socioeconomic History   Marital status: Married    Spouse name: Not on file   Number of children: Not on file   Years of education: Not on file   Highest education level: Not on file  Occupational History  Not on file  Tobacco Use   Smoking status: Every Day    Types: Cigarettes   Smokeless tobacco: Never  Vaping Use   Vaping Use: Never used  Substance and Sexual Activity   Alcohol use: Yes    Alcohol/week: 7.0 standard drinks    Types: 7 Glasses of wine per week   Drug use: No   Sexual activity: Yes    Partners: Male    Birth control/protection: Post-menopausal  Other Topics Concern   Not on file  Social History Narrative   Not on file   Social Determinants of Health   Financial Resource Strain: Not on file  Food Insecurity: Not on file  Transportation Needs: Not on file  Physical Activity: Not on file  Stress: Not on file  Social Connections: Not on file  Intimate Partner Violence: Not on file    ROS  PHYSICAL EXAMINATION:    BP 110/82    Pulse 76    Ht 5\' 2"  (1.575 m)    Wt 142 lb (64.4 kg)    LMP 07/18/2017 (Approximate)    SpO2 98%    BMI  25.97 kg/m     General appearance: alert, cooperative and appears stated age Neck: no adenopathy, supple, symmetrical, trachea midline and thyroid  is enlarged R>L Heart: regular rate and rhythm Lungs: CTAB Abdomen: soft, non-tender; bowel sounds normal; no masses,  no organomegaly Extremities: normal, atraumatic, no cyanosis Skin: normal color, texture and turgor, no rashes or lesions Lymph: normal cervical supraclavicular and inguinal nodes Neurologic: grossly normal  Pelvic ultrasound  Indications: PMP spotting  Findings:  Uterus 5.74 x 2.90 x 2.96 cm 1.07 x 1.0 cm, posterior/intramural myoma  Endometrium 9.84 mm, mixed echogenicity, vascular, suspicious for endometrial polyp  Left ovary 2.68 x 1.65 x 0.99 cm   Right ovary 1.79 x 1.10 x 0.82 cm  No free fluid  Impression:  Small anteverted uterus 1 cm intramural myoma Thickened endometrium, suspect endometrial polyp Bilaterally atrophic ovaries No free fluid  1. Postmenopausal bleeding Thickened endometrium with vascularity, suspicious for endometrial polyp Plan: hysteroscopy, dilation and curettage. Reviewed risks, including: bleeding, infection, uterine perforation. ACOG handouts for hysteroscopy, D&C given  2. Thickened endometrium See above  3. History of trichomonal vaginitis She has been treated and had negative f/u testing. Her other STD testing has been negative. Her husband hasn't been treated, reports negative testings and states he was told he didn't need treatment We discussed the importance of him being treated even if he had negative testing (could have a false negative test Will treat him with expedited partner therapy with Flagyl 2 grams po. No h/o renal or liver disease. Glorianne Manchester, RN will confirm no allergies and call in the medication for him  In addition to reviewing the ultrasound results, over 20 minutes was spent in total patient care including management and counseling.

## 2021-05-14 NOTE — Telephone Encounter (Signed)
Surgery: CPT [phone] - Hysteroscopy/D&C/Myosure,   Diagnosis: N95.0 Postmenopausal Bleeding, R93.89 Thickened Endometrium,   Location: [facility]  Status: Outpatient  Time: 48 Minutes  Assistant: N/A  Urgency: At Patient's Convenience  Pre-Op Appointment: Completed  Post-Op Appointment(s): 1 Week,   Time Out Of Work: Day Of Surgery, 1 Day Post Op

## 2021-05-14 NOTE — H&P (View-Only) (Signed)
GYNECOLOGY  VISIT   HPI: 56 y.o.   Married Black or [place] American Not Hispanic or Latino  female   [phone] with Patient's last menstrual period was 07/18/2017 (approximate).   here for evaluation of PMP spotting from last month.    Pap from 04/10/21 was WNL, negative HPV, negative GC/CT, + trich. She was treated for trich and had repeat testing for trich on 05/11/21 that was negative.   GYNECOLOGIC HISTORY: Patient's last menstrual period was 07/18/2017 (approximate). Contraception:PMP Menopausal hormone therapy: no        OB History     Gravida  2   Para  1   Term  1   Preterm      AB  1   Living  1      SAB  1   IAB      Ectopic      Multiple      Live Births  1              Patient Active Problem List   Diagnosis Date Noted   Episodic tension-type headache 04/10/2021   Thyromegaly 04/10/2021   Prediabetes 02/14/2019   Carpal tunnel syndrome, bilateral 06/29/2017   Laryngitis 06/08/2017   Multinodular goiter 02/04/2017   Leg hematoma, right, sequela 01/03/2017   Onychomycosis 12/01/2016   Cerumen impaction 12/01/2016   Vitamin D deficiency 12/01/2016   Dry mouth 06/02/2016   Hand eczema 03/24/2016   Acute upper respiratory infection 01/22/2016   Allergic urticaria 01/21/2016   Well adult exam 12/24/2015   Allergic rhinitis 12/24/2015   Intertriginous candidiasis 12/24/2015   Essential hypertension 12/24/2015    Past Medical History:  Diagnosis Date   Allergy    Hypertension    Multinodular goiter    Prediabetes 02/14/2019    Past Surgical History:  Procedure Laterality Date   BREAST BIOPSY Left 05/24/2019   FIBROCYSTIC CHANGES INCLUDING APOCRINE    Current Outpatient Medications  Medication Sig Dispense Refill   amLODipine (NORVASC) 5 MG tablet TAKE 1 TABLET (5 MG TOTAL) BY MOUTH DAILY. PATIENT NEEDS OFFICE VISIT BEFORE REFILLS WILL BE GIVEN 90 tablet 3   Calcium Carbonate-Vit D-Min (CALTRATE 600+D PLUS PO) Take by mouth.      Dermatological Products, Misc. (NONYX) GEL Apply topically.     halobetasol (ULTRAVATE) 0.05 % cream Apply topically 2 (two) times daily.     irbesartan-hydrochlorothiazide (AVALIDE) 300-12.5 MG tablet TAKE 1 TABLET BY MOUTH EVERY DAY 90 tablet 1   Multiple Vitamin (MULTIVITAMIN PO) Take by mouth.     Current Facility-Administered Medications  Medication Dose Route Frequency Provider Last Rate Last Admin   0.9 %  sodium chloride infusion  500 mL Intravenous Continuous Irene Shipper, MD         ALLERGIES: Patient has no known allergies.  Family History  Problem Relation Age of Onset   Lung cancer Mother    Diabetes Father    Diabetes Brother    Colon cancer Neg Hx    Colon polyps Neg Hx    Esophageal cancer Neg Hx    Rectal cancer Neg Hx    Stomach cancer Neg Hx    Thyroid disease Neg Hx     Social History   Socioeconomic History   Marital status: Married    Spouse name: Not on file   Number of children: Not on file   Years of education: Not on file   Highest education level: Not on file  Occupational History  Not on file  Tobacco Use   Smoking status: Every Day    Types: Cigarettes   Smokeless tobacco: Never  Vaping Use   Vaping Use: Never used  Substance and Sexual Activity   Alcohol use: Yes    Alcohol/week: 7.0 standard drinks    Types: 7 Glasses of wine per week   Drug use: No   Sexual activity: Yes    Partners: Male    Birth control/protection: Post-menopausal  Other Topics Concern   Not on file  Social History Narrative   Not on file   Social Determinants of Health   Financial Resource Strain: Not on file  Food Insecurity: Not on file  Transportation Needs: Not on file  Physical Activity: Not on file  Stress: Not on file  Social Connections: Not on file  Intimate Partner Violence: Not on file    ROS  PHYSICAL EXAMINATION:    BP 110/82    Pulse 76    Ht 5\' 2"  (1.575 m)    Wt 142 lb (64.4 kg)    LMP 07/18/2017 (Approximate)    SpO2 98%    BMI  25.97 kg/m     General appearance: alert, cooperative and appears stated age Neck: no adenopathy, supple, symmetrical, trachea midline and thyroid  is enlarged R>L Heart: regular rate and rhythm Lungs: CTAB Abdomen: soft, non-tender; bowel sounds normal; no masses,  no organomegaly Extremities: normal, atraumatic, no cyanosis Skin: normal color, texture and turgor, no rashes or lesions Lymph: normal cervical supraclavicular and inguinal nodes Neurologic: grossly normal  Pelvic ultrasound  Indications: PMP spotting  Findings:  Uterus 5.74 x 2.90 x 2.96 cm 1.07 x 1.0 cm, posterior/intramural myoma  Endometrium 9.84 mm, mixed echogenicity, vascular, suspicious for endometrial polyp  Left ovary 2.68 x 1.65 x 0.99 cm   Right ovary 1.79 x 1.10 x 0.82 cm  No free fluid  Impression:  Small anteverted uterus 1 cm intramural myoma Thickened endometrium, suspect endometrial polyp Bilaterally atrophic ovaries No free fluid  1. Postmenopausal bleeding Thickened endometrium with vascularity, suspicious for endometrial polyp Plan: hysteroscopy, dilation and curettage. Reviewed risks, including: bleeding, infection, uterine perforation. ACOG handouts for hysteroscopy, D&C given  2. Thickened endometrium See above  3. History of trichomonal vaginitis She has been treated and had negative f/u testing. Her other STD testing has been negative. Her husband hasn't been treated, reports negative testings and states he was told he didn't need treatment We discussed the importance of him being treated even if he had negative testing (could have a false negative test Will treat him with expedited partner therapy with Flagyl 2 grams po. No h/o renal or liver disease. Glorianne Manchester, RN will confirm no allergies and call in the medication for him  In addition to reviewing the ultrasound results, over 20 minutes was spent in total patient care including management and counseling.

## 2021-05-14 NOTE — Progress Notes (Unsigned)
Expedited partner therapy reviewed by Dr. Talbert Nan. Information sheet provided.  Partner information obtained.  Rx for Flagyl 500 mg tab. Take 2 grams PO once. Dispense 4 tabs/0RF Call placed to [facility], spoke with Twain. Verbal order given. Read back and confirmed.

## 2021-05-15 NOTE — Telephone Encounter (Signed)
Spoke with patient. Patient can not talk right now, will return call later.

## 2021-05-18 NOTE — Telephone Encounter (Signed)
Spoke with patient, reviewed surgery dates.  Patient request to proceed with surgery on 06/09/21, declines earlier dates offered.   Patient states she has additional questions regarding surgery, patient declines to discuss with RN. Strongly encouraged OV or MyChart visit with Dr. Talbert Nan to further discuss, patient declined. Advised if she changes her mind to return call to office to schedule.   Patient verbalizes understanding.   Surgery request sent.   Routing to [employer] for benefits.  Dr. Rosann Auerbach

## 2021-05-20 NOTE — Telephone Encounter (Signed)
Spoke with patient regarding surgery benefits. Patient acknowledges understanding of information presented. Patient is aware that benefits presented are professional benefits only. Patient is aware the hospital will call with facility benefits. See account note.  Patient has additional questions regarding surgery. Offered patient an office visit with Dr. Talbert Nan, patient declined.   I spoke with Dr. Talbert Nan regarding the situation. Dr Talbert Nan stated that she would attempt to reach out to the patient to answer questions.

## 2021-05-20 NOTE — Telephone Encounter (Signed)
Called patient, left a message that I was calling to answer her questions. She can call back and let me know when she is available, I can potentially call her at lunch time or the end of the day tomorrow. She can also ask Glorianne Manchester, RN her questions or return for a visit.

## 2021-05-21 NOTE — Telephone Encounter (Signed)
Spoke with patient. Surgery date request confirmed.  Advised surgery is scheduled for 06/09/21, [facility] at 0730.  Surgery instruction sheet and hospital brochure reviewed, printed copy will be mailed. Patient advised if Covid screening and quarantine requirements and agreeable.   Advised per Dr. Talbert Nan below -patient declines OV at this time or return call from Dr. Talbert Nan. Patient states she will return call to office if she desires additional OV or call from Dr. Talbert Nan .   Routing to provider. Encounter closed.  Cc: Hayley Carder

## 2021-05-27 NOTE — Telephone Encounter (Signed)
Patient informed Whitney Patton that she still has questions regarding surgery. Returned call to patient.  Patient stated that she still has questions regarding surgery and there was a miscommunication regarding Dr. Gentry Fitz attempted phone call on 05/20/2021. Patient stated that she would like for Dr. Talbert Nan to call. Patient stated that she is available all day today to speak with Dr. Talbert Nan. Patient apologized for miscommunication and very appreciative of return call.  Routing to Dr. Talbert Nan to return call to patient at provider's convenience.

## 2021-05-28 NOTE — Telephone Encounter (Signed)
I spoke with the patient yesterday and answered all of her questions.

## 2021-06-02 ENCOUNTER — Encounter (HOSPITAL_BASED_OUTPATIENT_CLINIC_OR_DEPARTMENT_OTHER): Payer: Self-pay | Admitting: Obstetrics and Gynecology

## 2021-06-02 ENCOUNTER — Other Ambulatory Visit: Payer: Self-pay

## 2021-06-02 NOTE — Progress Notes (Signed)
Spoke w/ via phone for pre-op interview--- [facility] Lab needs dos----  EKG and ISTAT             Lab results------ COVID test -----patient states asymptomatic no test needed Arrive at -------0530 NPO after MN NO Solid Food.   Med rec completed Medications to take morning of surgery -----Norvasc Diabetic medication ----- Patient instructed no nail polish to be worn day of surgery Patient instructed to bring photo id and insurance card day of surgery Patient aware to have Driver (ride ) / caregiver  Husband Kinzley Savell   for 24 hours after surgery  Patient Special Instructions ----- Pre-Op special Istructions ----- Patient verbalized understanding of instructions that were given at this phone interview. Patient denies shortness of breath, chest pain, fever, cough at this phone interview.

## 2021-06-04 ENCOUNTER — Other Ambulatory Visit: Payer: Self-pay | Admitting: Obstetrics and Gynecology

## 2021-06-04 DIAGNOSIS — Z1231 Encounter for screening mammogram for malignant neoplasm of breast: Secondary | ICD-10-CM

## 2021-06-09 ENCOUNTER — Encounter (HOSPITAL_BASED_OUTPATIENT_CLINIC_OR_DEPARTMENT_OTHER)
Admission: RE | Disposition: A | Discharge status: Home / Self Care | Payer: Self-pay | Source: Ambulatory Visit | Attending: Obstetrics and Gynecology

## 2021-06-09 ENCOUNTER — Other Ambulatory Visit: Payer: Self-pay

## 2021-06-09 ENCOUNTER — Ambulatory Visit (HOSPITAL_BASED_OUTPATIENT_CLINIC_OR_DEPARTMENT_OTHER)
Admission: RE | Admit: 2021-06-09 | Discharge: 2021-06-09 | Disposition: A | Discharge status: Home / Self Care | Payer: 59 | Source: Ambulatory Visit | Attending: Obstetrics and Gynecology | Admitting: Obstetrics and Gynecology

## 2021-06-09 ENCOUNTER — Encounter (HOSPITAL_BASED_OUTPATIENT_CLINIC_OR_DEPARTMENT_OTHER): Payer: Self-pay | Admitting: Obstetrics and Gynecology

## 2021-06-09 ENCOUNTER — Ambulatory Visit (HOSPITAL_BASED_OUTPATIENT_CLINIC_OR_DEPARTMENT_OTHER): Payer: 59 | Admitting: Certified Registered"

## 2021-06-09 DIAGNOSIS — Z79899 Other long term (current) drug therapy: Secondary | ICD-10-CM | POA: Diagnosis not present

## 2021-06-09 DIAGNOSIS — N950 Postmenopausal bleeding: Secondary | ICD-10-CM | POA: Insufficient documentation

## 2021-06-09 DIAGNOSIS — F17210 Nicotine dependence, cigarettes, uncomplicated: Secondary | ICD-10-CM | POA: Diagnosis not present

## 2021-06-09 DIAGNOSIS — I10 Essential (primary) hypertension: Secondary | ICD-10-CM | POA: Diagnosis not present

## 2021-06-09 DIAGNOSIS — N840 Polyp of corpus uteri: Secondary | ICD-10-CM | POA: Insufficient documentation

## 2021-06-09 DIAGNOSIS — R9389 Abnormal findings on diagnostic imaging of other specified body structures: Secondary | ICD-10-CM | POA: Diagnosis present

## 2021-06-09 HISTORY — DX: Psoriasis, unspecified: L40.9

## 2021-06-09 HISTORY — PX: DILATATION & CURETTAGE/HYSTEROSCOPY WITH MYOSURE: SHX6511

## 2021-06-09 LAB — POCT I-STAT, CHEM 8
BUN: 19 mg/dL (ref 6–20)
Calcium, Ion: 1.29 mmol/L (ref 1.15–1.40)
Chloride: 105 mmol/L (ref 98–111)
Creatinine, Ser: 0.7 mg/dL (ref 0.44–1.00)
Glucose, Bld: 120 mg/dL — ABNORMAL HIGH (ref 70–99)
HCT: 43 % (ref 36.0–46.0)
Hemoglobin: 14.6 g/dL (ref 12.0–15.0)
Potassium: 3.9 mmol/L (ref 3.5–5.1)
Sodium: 141 mmol/L (ref 135–145)
TCO2: 27 mmol/L (ref 22–32)

## 2021-06-09 SURGERY — DILATATION & CURETTAGE/HYSTEROSCOPY WITH MYOSURE
Anesthesia: General | Site: Vagina

## 2021-06-09 MED ORDER — FENTANYL CITRATE (PF) 100 MCG/2ML IJ SOLN
INTRAMUSCULAR | Status: AC
  Filled 2021-06-09: qty 2

## 2021-06-09 MED ORDER — MIDAZOLAM HCL 5 MG/5ML IJ SOLN
INTRAMUSCULAR | Status: DC | PRN
  Administered 2021-06-09: 2 mg via INTRAVENOUS

## 2021-06-09 MED ORDER — ONDANSETRON HCL 4 MG/2ML IJ SOLN
INTRAMUSCULAR | Status: DC | PRN
  Administered 2021-06-09: 4 mg via INTRAVENOUS

## 2021-06-09 MED ORDER — OXYCODONE HCL 5 MG PO TABS: 5.0000 mg | ORAL_TABLET | Freq: Once | ORAL | Status: DC | PRN

## 2021-06-09 MED ORDER — OXYCODONE HCL 5 MG/5ML PO SOLN: 5.0000 mg | Freq: Once | ORAL | Status: DC | PRN

## 2021-06-09 MED ORDER — KETOROLAC TROMETHAMINE 30 MG/ML IJ SOLN
INTRAMUSCULAR | Status: DC | PRN
  Administered 2021-06-09: 30 mg via INTRAVENOUS

## 2021-06-09 MED ORDER — SODIUM CHLORIDE 0.9 % IR SOLN
Status: DC | PRN
Start: 2021-06-09 — End: 2021-06-09
  Administered 2021-06-09: 3000 mL

## 2021-06-09 MED ORDER — ONDANSETRON HCL 4 MG/2ML IJ SOLN
INTRAMUSCULAR | Status: AC
  Filled 2021-06-09: qty 2

## 2021-06-09 MED ORDER — PROPOFOL 10 MG/ML IV BOLUS
INTRAVENOUS | Status: AC
  Filled 2021-06-09: qty 20

## 2021-06-09 MED ORDER — LIDOCAINE 2% (20 MG/ML) 5 ML SYRINGE
INTRAMUSCULAR | Status: DC | PRN
  Administered 2021-06-09: 60 mg via INTRAVENOUS

## 2021-06-09 MED ORDER — DEXAMETHASONE SODIUM PHOSPHATE 10 MG/ML IJ SOLN
INTRAMUSCULAR | Status: DC | PRN
  Administered 2021-06-09: 5 mg via INTRAVENOUS

## 2021-06-09 MED ORDER — PROPOFOL 10 MG/ML IV BOLUS
INTRAVENOUS | Status: DC | PRN
  Administered 2021-06-09: 180 mg via INTRAVENOUS

## 2021-06-09 MED ORDER — FENTANYL CITRATE (PF) 100 MCG/2ML IJ SOLN
INTRAMUSCULAR | Status: DC | PRN
  Administered 2021-06-09: 50 ug via INTRAVENOUS

## 2021-06-09 MED ORDER — PHENYLEPHRINE 40 MCG/ML (10ML) SYRINGE FOR IV PUSH (FOR BLOOD PRESSURE SUPPORT)
PREFILLED_SYRINGE | INTRAVENOUS | Status: DC | PRN
  Administered 2021-06-09 (×2): 80 ug via INTRAVENOUS
  Administered 2021-06-09: 40 ug via INTRAVENOUS
  Administered 2021-06-09: 80 ug via INTRAVENOUS

## 2021-06-09 MED ORDER — LACTATED RINGERS IV SOLN: INTRAVENOUS | Status: DC

## 2021-06-09 MED ORDER — MIDAZOLAM HCL 2 MG/2ML IJ SOLN
INTRAMUSCULAR | Status: AC
  Filled 2021-06-09: qty 2

## 2021-06-09 MED ORDER — FENTANYL CITRATE (PF) 100 MCG/2ML IJ SOLN: 25.0000 ug | INTRAMUSCULAR | Status: DC | PRN

## 2021-06-09 MED ORDER — ONDANSETRON HCL 4 MG/2ML IJ SOLN: 4.0000 mg | Freq: Once | INTRAMUSCULAR | Status: DC | PRN

## 2021-06-09 MED ORDER — DEXAMETHASONE SODIUM PHOSPHATE 10 MG/ML IJ SOLN
INTRAMUSCULAR | Status: AC
  Filled 2021-06-09: qty 1

## 2021-06-09 MED ORDER — KETOROLAC TROMETHAMINE 30 MG/ML IJ SOLN
INTRAMUSCULAR | Status: AC
  Filled 2021-06-09: qty 1

## 2021-06-09 MED ORDER — KETOROLAC TROMETHAMINE 30 MG/ML IJ SOLN: 30.0000 mg | Freq: Once | INTRAMUSCULAR | Status: DC | PRN

## 2021-06-09 SURGICAL SUPPLY — 24 items
CATH ROBINSON RED A/P 16FR (CATHETERS) IMPLANT
DEVICE MYOSURE LITE (MISCELLANEOUS) IMPLANT
DEVICE MYOSURE REACH (MISCELLANEOUS) ×1 IMPLANT
DILATOR CANAL MILEX (MISCELLANEOUS) IMPLANT
DRSG TELFA 3X8 NADH (GAUZE/BANDAGES/DRESSINGS) ×2 IMPLANT
GAUZE 4X4 16PLY ~~LOC~~+RFID DBL (SPONGE) ×2 IMPLANT
GLOVE SURG ENC MOIS LTX SZ6.5 (GLOVE) ×2 IMPLANT
GLOVE SURG UNDER POLY LF SZ7 (GLOVE) ×2 IMPLANT
GLOVE SURG UNDER POLY LF SZ7.5 (GLOVE) ×1 IMPLANT
GOWN STRL REUS W/TWL LRG LVL3 (GOWN DISPOSABLE) ×3 IMPLANT
IV NS IRRIG 3000ML ARTHROMATIC (IV SOLUTION) ×2 IMPLANT
KIT PROCEDURE FLUENT (KITS) ×2 IMPLANT
KIT TURNOVER CYSTO (KITS) ×2 IMPLANT
MYOSURE XL FIBROID (MISCELLANEOUS)
PACK VAGINAL MINOR WOMEN LF (CUSTOM PROCEDURE TRAY) ×2 IMPLANT
PAD DRESSING TELFA 3X8 NADH (GAUZE/BANDAGES/DRESSINGS) ×1 IMPLANT
PAD OB MATERNITY 4.3X12.25 (PERSONAL CARE ITEMS) ×2 IMPLANT
PAD PREP 24X48 CUFFED NSTRL (MISCELLANEOUS) ×2 IMPLANT
SEAL CERVICAL OMNI LOK (ABLATOR) IMPLANT
SEAL ROD LENS SCOPE MYOSURE (ABLATOR) ×2 IMPLANT
SYR 20ML LL LF (SYRINGE) IMPLANT
SYSTEM TISS REMOVAL MYOSURE XL (MISCELLANEOUS) IMPLANT
TOWEL OR 17X26 10 PK STRL BLUE (TOWEL DISPOSABLE) ×2 IMPLANT
WATER STERILE IRR 500ML POUR (IV SOLUTION) IMPLANT

## 2021-06-09 NOTE — Anesthesia Procedure Notes (Signed)
Procedure Name: LMA Insertion Date/Time: 06/09/2021 7:36 AM Performed by: Gwyndolyn Saxon, CRNA Pre-anesthesia Checklist: Patient identified, Emergency Drugs available, Suction available and Patient being monitored Patient Re-evaluated:Patient Re-evaluated prior to induction Oxygen Delivery Method: Circle system utilized Preoxygenation: Pre-oxygenation with 100% oxygen Induction Type: IV induction Ventilation: Mask ventilation without difficulty LMA: LMA inserted LMA Size: 4.0 Number of attempts: 1 Placement Confirmation: positive ETCO2 and breath sounds checked- equal and bilateral Tube secured with: Tape Dental Injury: Teeth and Oropharynx as per pre-operative assessment

## 2021-06-09 NOTE — Anesthesia Postprocedure Evaluation (Signed)
Anesthesia Post Note  Patient: Whitney Patton  Procedure(s) Performed: DILATATION & CURETTAGE/HYSTEROSCOPY WITH MYOSURE (Vagina )     Patient location during evaluation: PACU Anesthesia Type: General Level of consciousness: awake and alert Pain management: pain level controlled Vital Signs Assessment: post-procedure vital signs reviewed and stable Respiratory status: spontaneous breathing, nonlabored ventilation, respiratory function stable and patient connected to nasal cannula oxygen Cardiovascular status: blood pressure returned to baseline and stable Postop Assessment: no apparent nausea or vomiting Anesthetic complications: no   No notable events documented.  Last Vitals:  Vitals:   06/09/21 0809 06/09/21 0815  BP: 102/71 103/72  Pulse: 80 78  Resp: 17 13  Temp: (!) 36.4 C   SpO2: 99% 99%    Last Pain:  Vitals:   06/09/21 0815  TempSrc:   PainSc: 2                  [name]

## 2021-06-09 NOTE — Interval H&P Note (Signed)
History and Physical Interval Note:  06/09/2021 7:15 AM  Whitney Patton  has presented today for surgery, with the diagnosis of postmenopausal bleeding, thickened endometrium.  The various methods of treatment have been discussed with the patient and family. After consideration of risks, benefits and other options for treatment, the patient has consented to  Procedure(s): [place] (N/A) as a surgical intervention.  The patient's history has been reviewed, patient examined, no change in status, stable for surgery.  I have reviewed the patient's chart and labs.  Questions were answered to the patient's satisfaction.     Salvadore Dom

## 2021-06-09 NOTE — Anesthesia Preprocedure Evaluation (Signed)
Anesthesia Evaluation  Patient identified by MRN, date of birth, ID band Patient awake    Reviewed: Allergy & Precautions, NPO status , Patient's Chart, lab work & pertinent test results  Airway Mallampati: II  TM Distance: >3 FB Neck ROM: Full    Dental no notable dental hx.    Pulmonary neg pulmonary ROS, Current Smoker,    Pulmonary exam normal breath sounds clear to auscultation       Cardiovascular hypertension, Pt. on medications Normal cardiovascular exam Rhythm:Regular Rate:Normal     Neuro/Psych negative neurological ROS  negative psych ROS   GI/Hepatic negative GI ROS, Neg liver ROS,   Endo/Other  negative endocrine ROS  Renal/GU negative Renal ROS  negative genitourinary   Musculoskeletal negative musculoskeletal ROS (+)   Abdominal   Peds negative pediatric ROS (+)  Hematology negative hematology ROS (+)   Anesthesia Other Findings   Reproductive/Obstetrics negative OB ROS                             Anesthesia Physical Anesthesia Plan  ASA: 2  Anesthesia Plan: General   Post-op Pain Management: Minimal or no pain anticipated   Induction: Intravenous  PONV Risk Score and Plan: 2 and Ondansetron, Dexamethasone, Midazolam and Treatment may vary due to age or medical condition  Airway Management Planned: LMA  Additional Equipment:   Intra-op Plan:   Post-operative Plan: Extubation in OR  Informed Consent: I have reviewed the patients History and Physical, chart, labs and discussed the procedure including the risks, benefits and alternatives for the proposed anesthesia with the patient or authorized representative who has indicated his/her understanding and acceptance.     Dental advisory given  Plan Discussed with: CRNA and Surgeon  Anesthesia Plan Comments:         Anesthesia Quick Evaluation

## 2021-06-09 NOTE — Discharge Instructions (Signed)
DISCHARGE INSTRUCTIONS: D&C The following instructions have been prepared to help you care for yourself upon your return home.   Personal hygiene:  Use sanitary pads for vaginal drainage, not tampons.  Shower the day after your procedure.  NO tub baths, pools or Jacuzzis for 2-3 weeks.  Wipe front to back after using the bathroom.  Activity and limitations:  Do NOT drive or operate any equipment for 24 hours. The effects of anesthesia are still present and drowsiness may result.  Do NOT rest in bed all day.  Walking is encouraged.  Walk up and down stairs slowly.  You may resume your normal activity in one to two days or as indicated by your physician.  Sexual activity: NO intercourse for at least 2 weeks after the procedure, or as indicated by your physician.  Diet: Eat a light meal as desired this evening. You may resume your usual diet tomorrow.  Return to work: You may resume your work activities in one to two days or as indicated by your doctor.  What to expect after your surgery: Expect to have vaginal bleeding/discharge for 2-3 days and spotting for up to 10 days. It is not unusual to have soreness for up to 1-2 weeks. You may have a slight burning sensation when you urinate for the first day. Mild cramps may continue for a couple of days. You may have a regular period in 2-6 weeks.  Call your doctor for any of the following:  Excessive vaginal bleeding, saturating and changing one pad every hour.  Inability to urinate 6 hours after discharge from hospital.  Pain not relieved by pain medication.  Fever of 100.4 F or greater.  Unusual vaginal discharge or odor.  **No ibuprofen, Advil, Aleve, Motrin, ketorolac, meloxicam, naproxen, or other NSAIDS until after 2pm today if needed.   Post Anesthesia Home Care Instructions  Activity: Get plenty of rest for the remainder of the day. A responsible individual must stay with you for 24 hours following the procedure.  For the next  24 hours, DO NOT: -Drive a car -Paediatric nurse -Drink alcoholic beverages -Take any medication unless instructed by your physician -Make any legal decisions or sign important papers.  Meals: Start with liquid foods such as gelatin or soup. Progress to regular foods as tolerated. Avoid greasy, spicy, heavy foods. If nausea and/or vomiting occur, drink only clear liquids until the nausea and/or vomiting subsides. Call your physician if vomiting continues.  Special Instructions/Symptoms: Your throat may feel dry or sore from the anesthesia or the breathing tube placed in your throat during surgery. If this causes discomfort, gargle with warm salt water. The discomfort should disappear within 24 hours.

## 2021-06-09 NOTE — Transfer of Care (Signed)
Immediate Anesthesia Transfer of Care Note  Patient: Whitney Patton  Procedure(s) Performed: DILATATION & CURETTAGE/HYSTEROSCOPY WITH MYOSURE (Vagina )  Patient Location: PACU  Anesthesia Type:General  Level of Consciousness: drowsy and patient cooperative  Airway & Oxygen Therapy: Patient Spontanous Breathing and Patient connected to face mask oxygen  Post-op Assessment: Report given to RN and Post -op Vital signs reviewed and stable  Post vital signs: Reviewed and stable  Last Vitals:  Vitals Value Taken Time  BP 102/71 06/09/21 0808  Temp    Pulse 80 06/09/21 0810  Resp 15 06/09/21 0810  SpO2 99 % 06/09/21 0810  Vitals shown include unvalidated device data.  Last Pain:  Vitals:   06/09/21 0607  TempSrc:   PainSc: 0-No pain         Complications: No notable events documented.

## 2021-06-09 NOTE — Op Note (Signed)
Preoperative Diagnosis: Postmenopausal bleeding, thickened endometrium  Postoperative Diagnosis: Postmenopausal bleeding, endometrial polyup  Procedure: Hysteroscopy, polypectomy, dilation and curettage  Surgeon: Dr Sumner Boast  Assistants: None  Anesthesia: General via LMA  EBL: 5 cc  Fluids: 800 cc LR  Fluid deficit: 70 cc  Urine output: not recorded  Indications for surgery: The patient is a 56 yo female, who presented with postmenopausal bleeding. Ultrasound showed a thickened endometrium, suspicious for an endometrial polyp.  The risks of the surgery were reviewed with the patient and the consent form was signed prior to her surgery.  Findings: EUA: normal sized uterus, no adnexal masses. Hysteroscopy: endometrial polyp filling the uterus and entering the cervix, otherwise thin endometrium, normal tubal ostia bilaterally.   Specimens: endometrial polyp, endometrial curettings   Procedure: The patient was taken to the operating room with an IV in place. She was placed in the dorsal lithotomy position and anesthesia was administered. She was prepped and draped in the usual sterile fashion for a vaginal procedure. She voided on the way to the OR. A weighted speculum was placed in the vagina and a single tooth tenaculum was placed on the anterior lip of the cervix. The cervix was dilated to a #6 hagar dilator. The uterus was sounded to ~6 cm. The myosure hysteroscope was inserted into the uterine cavity. With continuous infusion of normal saline, the uterine cavity was visualized with the above findings. The myosure reach was used to resect the endometrial polyp. The myosure was then removed. The cavity was then curetted with the small sharp curette. The cavity had the characteristically gritty texture at the end of the procedure. The curette and the single tooth tenaculum were removed. The speculum was removed. The patients perineum was cleansed of betadine and she was taken out of the  dorsal lithotomy position.  Upon awakening the LMA was removed and the patient was transferred to the recovery room in stable and awake condition.  The sponge and instrument count were correct. There were no complications.

## 2021-06-10 ENCOUNTER — Encounter (HOSPITAL_BASED_OUTPATIENT_CLINIC_OR_DEPARTMENT_OTHER): Payer: Self-pay | Admitting: Obstetrics and Gynecology

## 2021-06-10 ENCOUNTER — Telehealth: Payer: Self-pay

## 2021-06-10 LAB — SURGICAL PATHOLOGY

## 2021-06-10 NOTE — Telephone Encounter (Signed)
-----   Message from Salvadore Dom, MD sent at 06/10/2021  4:07 PM EST ----- Please let the patient know that her pathology returned with a benign polyp and see how she is doing. She is s/p hysteroscopy, polypectomy, D&C yesterday. Surgery went great.

## 2021-06-10 NOTE — Telephone Encounter (Signed)
Patient informed.   She reports a little light cramping otherwise doing great.  She said to tell Dr. Talbert Nan thank you and she sends her love.

## 2021-06-15 ENCOUNTER — Telehealth: Payer: Self-pay | Admitting: Obstetrics and Gynecology

## 2021-06-15 NOTE — Telephone Encounter (Signed)
Please let the patient know that I received a copy of the EKG that Anesthesia ordered for her surgery. It was read out as abnormal, but it is read by the machine and that isn't always accurate. I'm not skilled at reading EKG's. I've sent a copy of it to her primary to see if she needs any further evaluation.

## 2021-06-15 NOTE — Telephone Encounter (Signed)
Spoke with patient, advised per Dr. Talbert Nan. Advised patient her PCP may reach out to her directly, if not, our office will call with any additional recommendations. Patient verbalizes understanding and is agreeable.

## 2021-06-18 ENCOUNTER — Encounter: Payer: Self-pay | Admitting: Obstetrics and Gynecology

## 2021-06-18 ENCOUNTER — Other Ambulatory Visit: Payer: Self-pay

## 2021-06-18 ENCOUNTER — Ambulatory Visit (INDEPENDENT_AMBULATORY_CARE_PROVIDER_SITE_OTHER): Payer: 59 | Admitting: Obstetrics and Gynecology

## 2021-06-18 VITALS — BP 120/70 | HR 66 | Wt 140.0 lb

## 2021-06-18 DIAGNOSIS — Z9889 Other specified postprocedural states: Secondary | ICD-10-CM

## 2021-06-18 NOTE — Progress Notes (Signed)
GYNECOLOGY  VISIT ?  ?HPI: ?56 y.o.   Married Black or [place] American Not Hispanic or Latino  female   ?[id number] with Patient's last menstrual period was 07/18/2017 (approximate).   ?here for post op appointment following D&C. Patient states that she has been doing well since surgery. She saw a little blood yesterday but nothing today.  ? ?GYNECOLOGIC HISTORY: ?Patient's last menstrual period was 07/18/2017 (approximate). ?Contraception:PMP  ?Menopausal hormone therapy: none  ?       ?OB History   ? ? Gravida  ?2  ? Para  ?1  ? Term  ?1  ? Preterm  ?   ? AB  ?1  ? Living  ?1  ?  ? ? SAB  ?1  ? IAB  ?   ? Ectopic  ?   ? Multiple  ?   ? Live Births  ?1  ?   ?  ?  ?    ? ?Patient Active Problem List  ? Diagnosis Date Noted  ? Episodic tension-type headache 04/10/2021  ? Thyromegaly 04/10/2021  ? Prediabetes 02/14/2019  ? Carpal tunnel syndrome, bilateral 06/29/2017  ? Laryngitis 06/08/2017  ? Multinodular goiter 02/04/2017  ? Leg hematoma, right, sequela 01/03/2017  ? Onychomycosis 12/01/2016  ? Cerumen impaction 12/01/2016  ? Vitamin D deficiency 12/01/2016  ? Dry mouth 06/02/2016  ? 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 Acute upper respiratory infection 01/22/2016  ? Allergic urticaria 01/21/2016  ? Well adult exam 12/24/2015  ? Allergic rhinitis 12/24/2015  ? Intertriginous candidiasis 12/24/2015  ? Essential hypertension 12/24/2015  ? ? ?Past Medical History:  ?Diagnosis Date  ? Allergy   ? Hypertension   ? Multinodular goiter   ? Prediabetes 02/14/2019  ? Psoriasis   ? ? ?Past Surgical History:  ?Procedure Laterality Date  ? BREAST BIOPSY Left 05/24/2019  ? FIBROCYSTIC CHANGES INCLUDING APOCRINE  ? DILATATION & CURETTAGE/HYSTEROSCOPY WITH MYOSURE N/A 06/09/2021  ? Procedure: DILATATION & CURETTAGE/HYSTEROSCOPY WITH MYOSURE;  Surgeon: Salvadore Dom, MD;  Location: [facility];  Service: Gynecology;  Laterality: N/A;  ? ? ?Current Outpatient Medications  ?Medication Sig Dispense Refill  ? amLODipine  (NORVASC) 5 MG tablet TAKE 1 TABLET (5 MG TOTAL) BY MOUTH DAILY. PATIENT NEEDS OFFICE VISIT BEFORE REFILLS WILL BE GIVEN 90 tablet 3  ? Calcium Carbonate-Vit D-Min (CALTRATE 600+D PLUS PO) Take by mouth.    ? Dermatological Products, Misc. (NONYX) GEL Apply topically.    ? halobetasol (ULTRAVATE) 0.05 % cream Apply topically 2 (two) times daily.    ? irbesartan-hydrochlorothiazide (AVALIDE) 300-12.5 MG tablet TAKE 1 TABLET BY MOUTH EVERY DAY 90 tablet 1  ? Multiple Vitamin (MULTIVITAMIN PO) Take by mouth.    ? ?No current facility-administered medications for this visit.  ?  ? ?ALLERGIES: Patient has no known allergies. ? ?Family History  ?Problem Relation Age of Onset  ? Lung cancer Mother   ? Diabetes Father   ? Diabetes Brother   ? Colon cancer Neg Hx   ? Colon polyps Neg Hx   ? Esophageal cancer Neg Hx   ? Rectal cancer Neg Hx   ? Stomach cancer Neg Hx   ? Thyroid disease Neg Hx   ? ? ?Social History  ? ?Socioeconomic History  ? Marital status: Married  ?  Spouse name: Not on file  ? Number of children: Not on file  ? Years of education: Not on file  ? Highest education level: Not on file  ?Occupational History  ?  Not on file  ?Tobacco Use  ? Smoking status: Every Day  ?  Types: Cigarettes  ? Smokeless tobacco: Never  ?Vaping Use  ? Vaping Use: Never used  ?Substance and Sexual Activity  ? Alcohol use: Yes  ?  Alcohol/week: 7.0 standard drinks  ?  Types: 7 Glasses of wine per week  ? Drug use: No  ? Sexual activity: Yes  ?  Partners: Male  ?  Birth control/protection: Post-menopausal  ?Other Topics Concern  ? Not on file  ?Social History Narrative  ? Not on file  ? ?Social Determinants of Health  ? ?Financial Resource Strain: Not on file  ?Food Insecurity: Not on file  ?Transportation Needs: Not on file  ?Physical Activity: Not on file  ?Stress: Not on file  ?Social Connections: Not on file  ?Intimate Partner Violence: Not on file  ? ? ?Review of Systems  ?All other systems reviewed and are negative. ? ?PHYSICAL  EXAMINATION:   ? ?BP 120/70   Pulse 66   Wt 140 lb (63.5 kg)   LMP 07/18/2017 (Approximate)   SpO2 98%   BMI 24.80 kg/m?     ?General appearance: alert, cooperative and appears stated age ?Abdomen: soft, non-tender; non distended, no masses,  no organomegaly ? ?1. History of hysteroscopy ?Benign endometrial polyp removed. She is doing well ?-Routine f/u ? ? ?

## 2021-06-22 NOTE — Telephone Encounter (Signed)
Dr. Talbert Nan -any f/u from PCP?  ?

## 2021-06-26 ENCOUNTER — Other Ambulatory Visit: Payer: 59

## 2021-07-02 ENCOUNTER — Ambulatory Visit
Admission: RE | Admit: 2021-07-02 | Discharge: 2021-07-02 | Disposition: A | Discharge status: Home / Self Care | Payer: 59 | Source: Ambulatory Visit | Attending: Endocrinology | Admitting: Endocrinology

## 2021-07-02 DIAGNOSIS — E042 Nontoxic multinodular goiter: Secondary | ICD-10-CM

## 2021-07-07 NOTE — Telephone Encounter (Signed)
I've not heard back from her primary. Will you please fax a copy of the EKG to his office. Thanks. ?

## 2021-07-09 NOTE — Telephone Encounter (Addendum)
Copy of 06/09/21 EKG faxed to PCP on file, Dr. Felipa Eth at [facility]. [phone].  ?

## 2021-07-16 ENCOUNTER — Ambulatory Visit
Admission: RE | Admit: 2021-07-16 | Discharge: 2021-07-16 | Disposition: A | Discharge status: Home / Self Care | Payer: 59 | Source: Ambulatory Visit | Attending: Obstetrics and Gynecology | Admitting: Obstetrics and Gynecology

## 2021-07-16 DIAGNOSIS — Z1231 Encounter for screening mammogram for malignant neoplasm of breast: Secondary | ICD-10-CM

## 2021-08-24 ENCOUNTER — Encounter: Payer: Self-pay | Admitting: Internal Medicine

## 2021-09-16 ENCOUNTER — Encounter: Payer: Self-pay | Admitting: Internal Medicine

## 2021-10-16 ENCOUNTER — Ambulatory Visit (AMBULATORY_SURGERY_CENTER): Payer: Self-pay

## 2021-10-16 VITALS — Ht 62.0 in | Wt 137.0 lb

## 2021-10-16 DIAGNOSIS — Z86010 Personal history of colonic polyps: Secondary | ICD-10-CM

## 2021-10-16 MED ORDER — NA SULFATE-K SULFATE-MG SULF 17.5-3.13-1.6 GM/177ML PO SOLN: 1.0000 | Freq: Once | ORAL | 0 refills | Status: AC

## 2021-10-16 NOTE — Progress Notes (Addendum)
No egg or soy allergy known to patient  No issues known to pt with past sedation with any surgeries or procedures Patient denies ever being told they had issues or difficulty with intubation  No FH of Malignant Hyperthermia Pt is not on diet pills Pt is not on  home 02  Pt is not on blood thinners  Pt denies issues with constipation  No A fib or A flutter   NO PA's for preps discussed with pt In PV today  Discussed with pt there will be an out-of-pocket cost for prep and that varies from $0 to 70 +  dollars - pt verbalized understanding  Pt instructed to use Singlecare.com or GoodRx for a price reduction on prep    Pt encouraged to call with questions or issues.  If pt has My chart, procedure instructions sent via My Chart  Insurance confirmed with pt at [facility] today

## 2021-11-04 IMAGING — MG MM DIGITAL SCREENING BILAT W/ TOMO AND CAD
8 series · 9 of 24 positions shown · non-contrast
Comparison: Previous exam(s).

CLINICAL DATA: Screening.

EXAM:
DIGITAL SCREENING BILATERAL MAMMOGRAM WITH TOMOSYNTHESIS AND CAD
TECHNIQUE: Bilateral screening digital craniocaudal and mediolateral oblique
mammograms were obtained. Bilateral screening digital breast
tomosynthesis was performed. The images were evaluated with
computer-aided detection.

[R CC synth-2D]
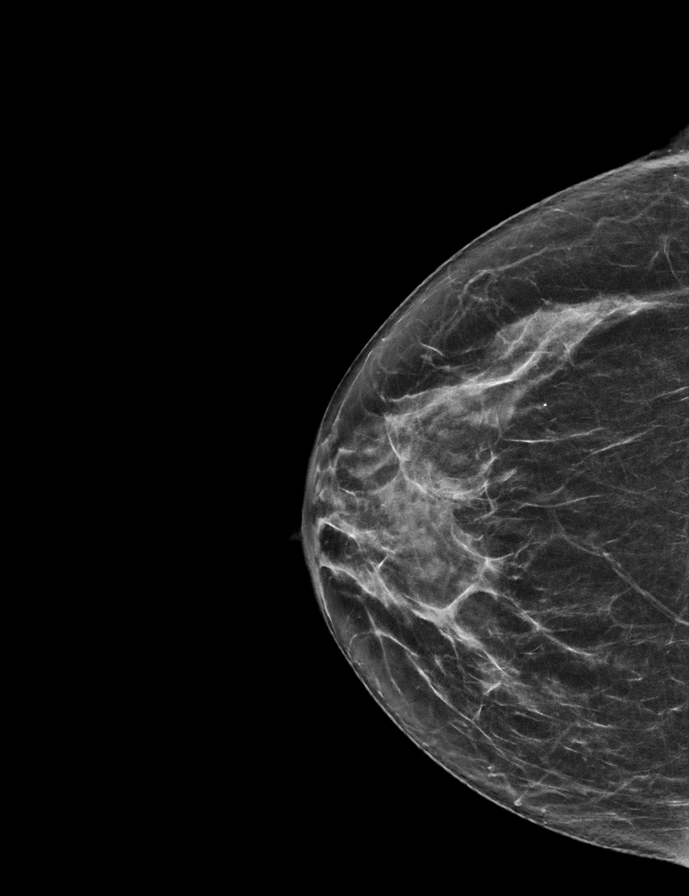

[L MLO synth-2D]
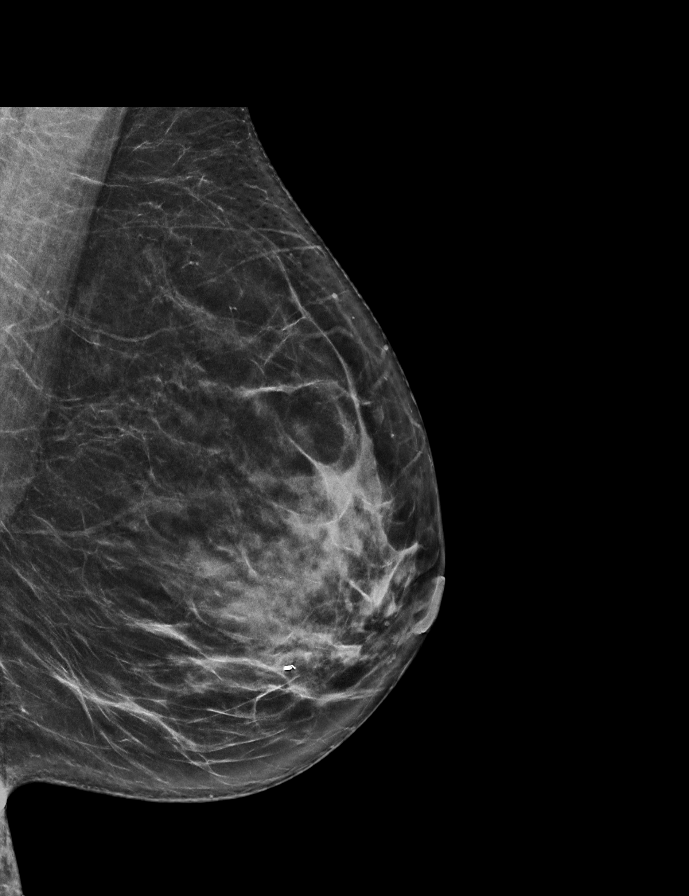

[L CC synth-2D]
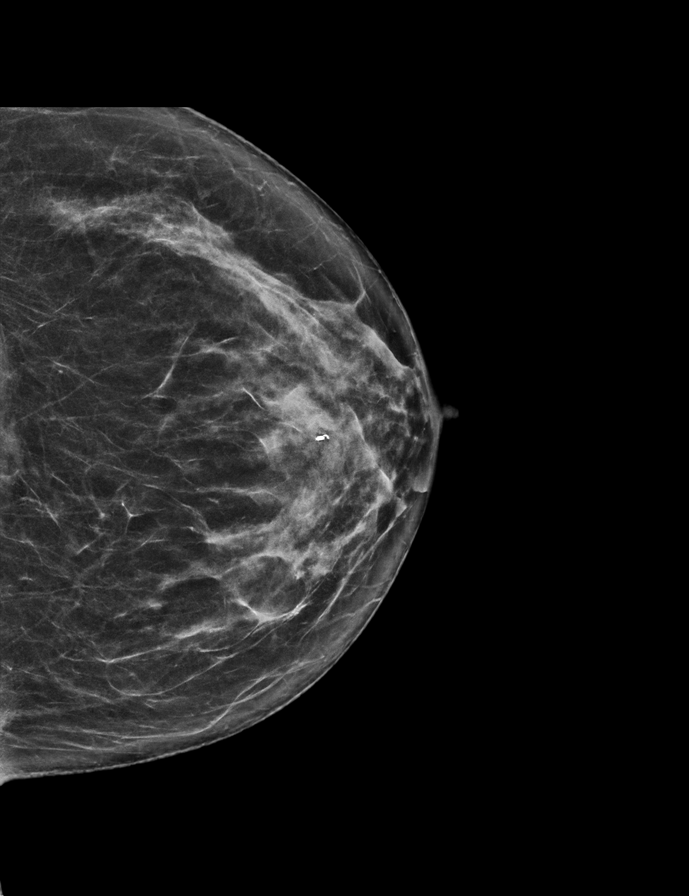

[R MLO synth-2D]
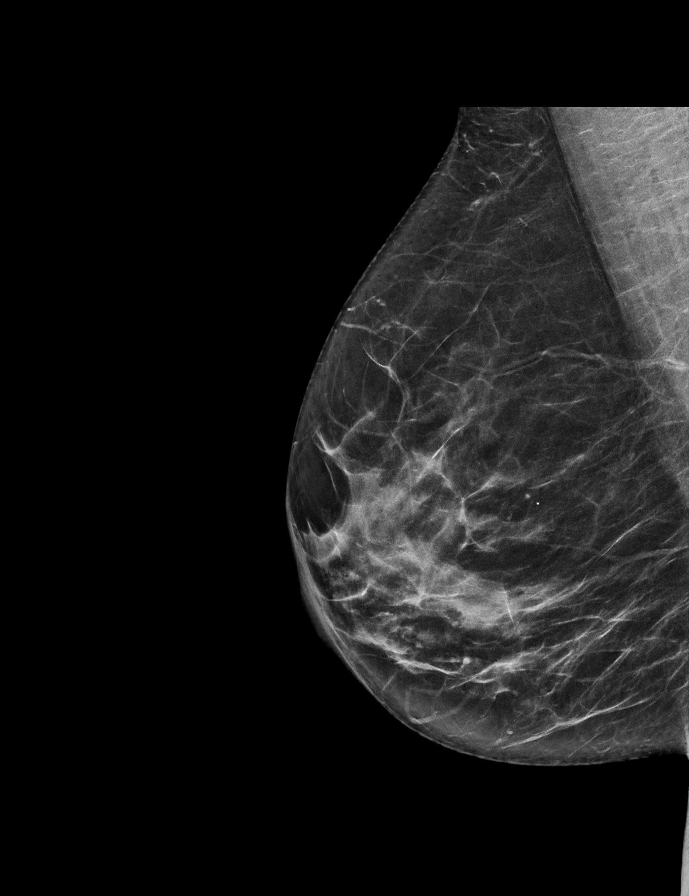

[L CC tomo · 2 of 64 frames shown]
[frame 21/64]
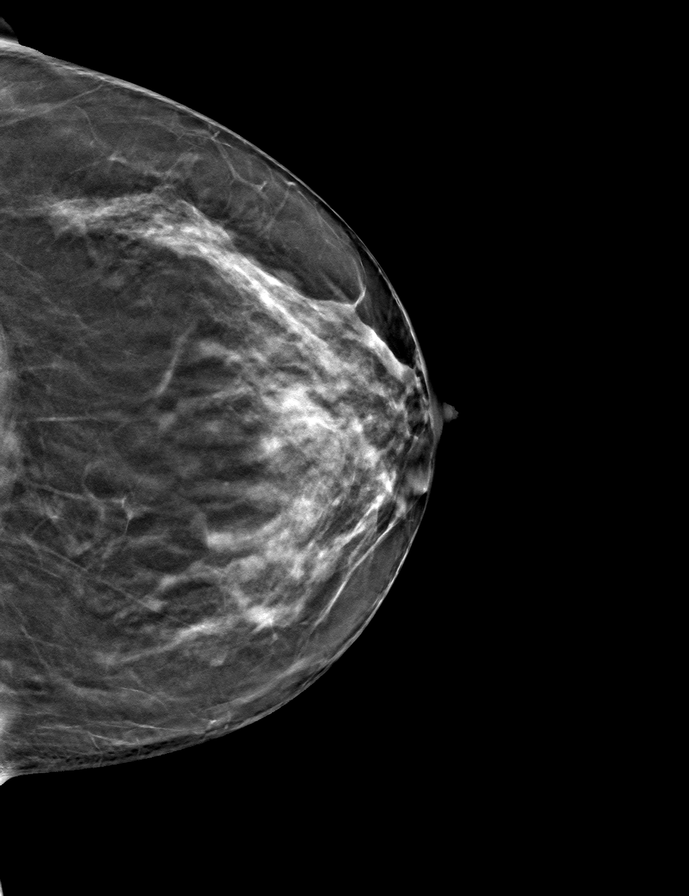
[frame 33/64]
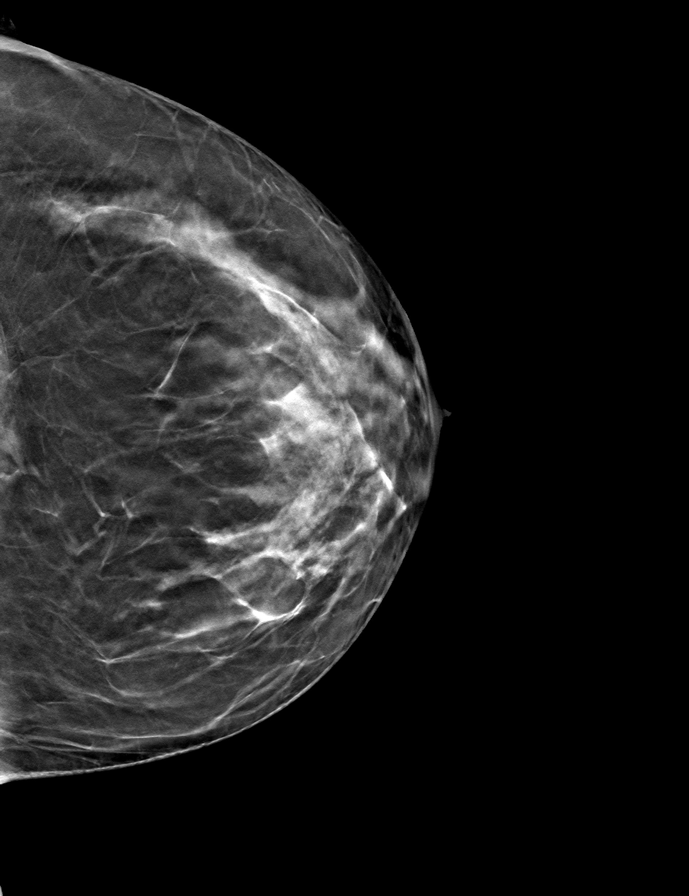

[R CC tomo · tomo slice 31/60.0]
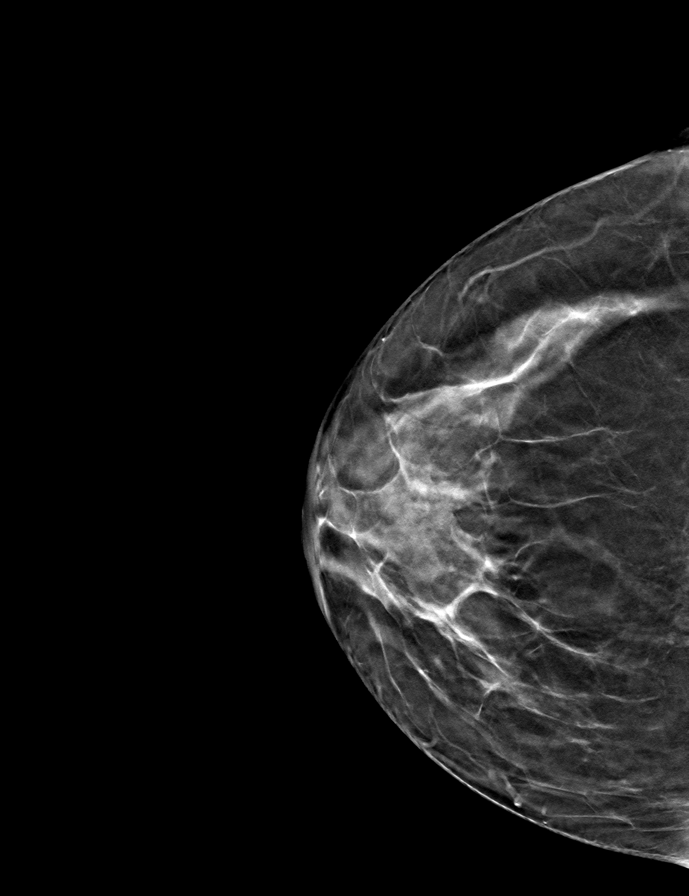

[R MLO tomo · tomo slice 33/66.0]
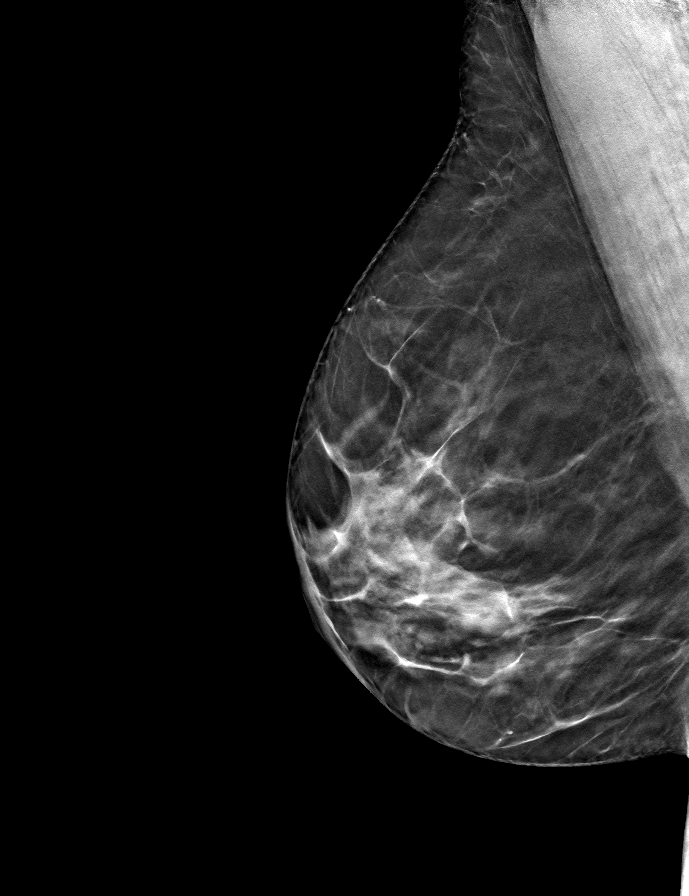

[L MLO tomo · tomo slice 33/65.0]
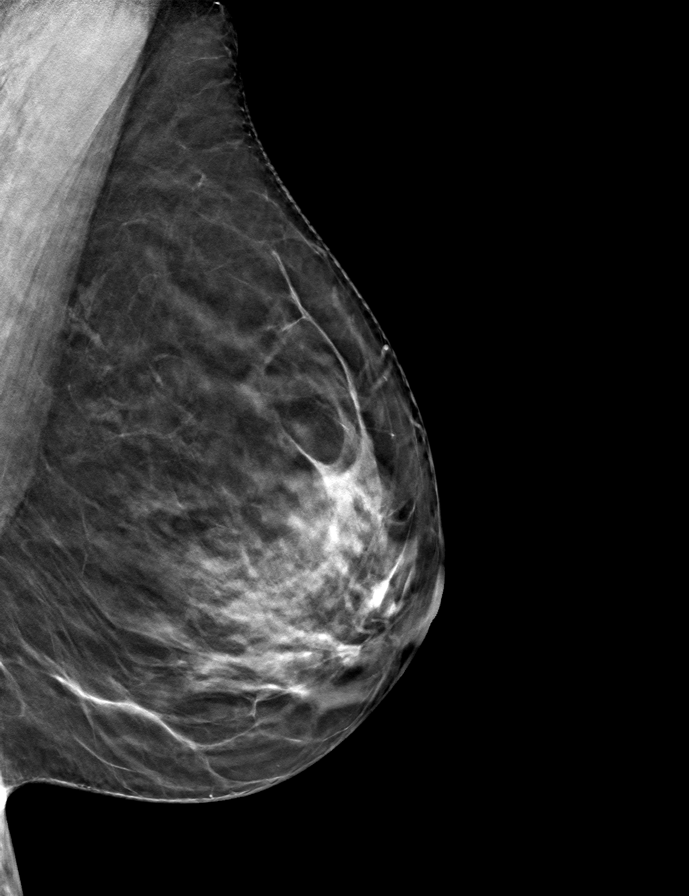

[9 of 24 positions shown; findings below may reference images not displayed]

ACR Breast Density Category c: The breast tissue is heterogeneously
dense, which may obscure small masses.
FINDINGS: There are no findings suspicious for malignancy. The images were
evaluated with computer-aided detection.
IMPRESSION: No mammographic evidence of malignancy. A result letter of this
screening mammogram will be mailed directly to the patient.

RECOMMENDATION:
Screening mammogram in one year. (Code:T4-5-GWO)

BI-RADS CATEGORY  1: Negative.

## 2021-11-09 ENCOUNTER — Encounter: Payer: Self-pay | Admitting: Internal Medicine

## 2021-11-13 ENCOUNTER — Encounter: Payer: Self-pay | Admitting: Internal Medicine

## 2021-11-13 ENCOUNTER — Ambulatory Visit (AMBULATORY_SURGERY_CENTER): Payer: 59 | Admitting: Internal Medicine

## 2021-11-13 VITALS — BP 111/67 | HR 67 | Temp 97.3°F | Resp 15 | Ht 62.0 in | Wt 137.0 lb

## 2021-11-13 DIAGNOSIS — Z09 Encounter for follow-up examination after completed treatment for conditions other than malignant neoplasm: Secondary | ICD-10-CM

## 2021-11-13 DIAGNOSIS — Z86010 Personal history of colonic polyps: Secondary | ICD-10-CM

## 2021-11-13 MED ORDER — SODIUM CHLORIDE 0.9 % IV SOLN: 500.0000 mL | Freq: Once | INTRAVENOUS | Status: DC

## 2021-11-13 NOTE — Op Note (Signed)
[place] Patient Name: Whitney Patton Procedure Date: 11/13/2021 12:23 PM MRN: [id number] Endoscopist: Docia Chuck. Henrene Pastor , MD Age: 56 Referring MD:  Date of Birth: Mar 30, 1966 Gender: Female Account #: [id number] Procedure:                Colonoscopy Indications:              High risk colon cancer surveillance: Personal                            history of non-advanced adenoma. Previous                            examination 2018 Medicines:                Monitored Anesthesia Care Procedure:                Pre-Anesthesia Assessment:                           - Prior to the procedure, a History and Physical                            was performed, and patient medications and                            allergies were reviewed. The patient's tolerance of                            previous anesthesia was also reviewed. The risks                            and benefits of the procedure and the sedation                            options and risks were discussed with the patient.                            All questions were answered, and informed consent                            was obtained. Prior Anticoagulants: The patient has                            taken no previous anticoagulant or antiplatelet                            agents. ASA Grade Assessment: II - A patient with                            mild systemic disease. After reviewing the risks                            and benefits, the patient was deemed in  satisfactory condition to undergo the procedure.                           After obtaining informed consent, the colonoscope                            was passed under direct vision. Throughout the                            procedure, the patient's blood pressure, pulse, and                            oxygen saturations were monitored continuously. The                            CF [id number] #[id number] was introduced through the anus                             and advanced to the the cecum, identified by                            appendiceal orifice and ileocecal valve. The                            ileocecal valve, appendiceal orifice, and rectum                            were photographed. The quality of the bowel                            preparation was excellent. The colonoscopy was                            performed without difficulty. The patient tolerated                            the procedure well. The bowel preparation used was                            SUPREP via split dose instruction. Scope In: 12:40:37 PM Scope Out: 12:54:26 PM Scope Withdrawal Time: 0 hours 11 minutes 36 seconds  Total Procedure Duration: 0 hours 13 minutes 49 seconds  Findings:                 The entire examined colon appeared normal on direct                            and retroflexion views. Complications:            No immediate complications. Estimated blood loss:                            None. Estimated Blood Loss:     Estimated blood loss: none. Impression:               - The  entire examined colon is normal on direct and                            retroflexion views.                           - No specimens collected. Recommendation:           - Repeat colonoscopy in 10 years for surveillance.                           - Patient has a contact number available for                            emergencies. The signs and symptoms of potential                            delayed complications were discussed with the                            patient. Return to normal activities tomorrow.                            Written discharge instructions were provided to the                            patient.                           - Resume previous diet.                           - Continue present medications. Docia Chuck. Henrene Pastor, MD 11/13/2021 1:01:38 PM This report has been signed electronically.

## 2021-11-13 NOTE — Progress Notes (Signed)
HISTORY OF PRESENT ILLNESS:  Whitney Patton is a 56 y.o. female who presents today for surveillance colonoscopy due to history of adenomatous colon polyps.  Prior exam 2018.  No complaints.  REVIEW OF SYSTEMS:  All non-GI ROS negative except for  Past Medical History:  Diagnosis Date   Allergy    seasonal   Hypertension    Multinodular goiter    Prediabetes 02/14/2019   Psoriasis     Past Surgical History:  Procedure Laterality Date   BREAST BIOPSY Left 05/24/2019   FIBROCYSTIC CHANGES INCLUDING APOCRINE   COLONOSCOPY  05/07/2016   DILATATION & CURETTAGE/HYSTEROSCOPY WITH MYOSURE N/A 06/09/2021   Procedure: DILATATION & CURETTAGE/HYSTEROSCOPY WITH MYOSURE;  Surgeon: Salvadore Dom, MD;  Location: [place];  Service: Gynecology;  Laterality: N/A;    Social History Whitney Patton  reports that she has been smoking cigarettes. She has never used smokeless tobacco. She reports current alcohol use of about 7.0 standard drinks of alcohol per week. She reports that she does not use drugs.  family history includes Diabetes in her brother and father; Lung cancer in her mother.  No Known Allergies     PHYSICAL EXAMINATION: Vital signs: BP 110/67   Pulse 72   Temp (!) 97.3 F (36.3 C) (Temporal)   Ht '5\' 2"'$  (1.575 m)   Wt 137 lb (62.1 kg)   LMP 07/18/2017 (Approximate)   SpO2 98%   BMI 25.06 kg/m  General: Well-developed, well-nourished, no acute distress HEENT: Sclerae are anicteric, conjunctiva pink. Oral mucosa intact Lungs: Clear Heart: Regular Abdomen: soft, nontender, nondistended, no obvious ascites, no peritoneal signs, normal bowel sounds. No organomegaly. Extremities: No edema Psychiatric: alert and oriented x3. Cooperative      ASSESSMENT:  History of adenomatous polyps.   PLAN:   Surveillance colonoscopy

## 2021-11-13 NOTE — Patient Instructions (Signed)
YOU HAD AN ENDOSCOPIC PROCEDURE TODAY AT THE  ENDOSCOPY CENTER:   Refer to the procedure report that was given to you for any specific questions about what was found during the examination.  If the procedure report does not answer your questions, please call your gastroenterologist to clarify.  If you requested that your care partner not be given the details of your procedure findings, then the procedure report has been included in a sealed envelope for you to review at your convenience later.  YOU SHOULD EXPECT: Some feelings of bloating in the abdomen. Passage of more gas than usual.  Walking can help get rid of the air that was put into your GI tract during the procedure and reduce the bloating. If you had a lower endoscopy (such as a colonoscopy or flexible sigmoidoscopy) you may notice spotting of blood in your stool or on the toilet paper. If you underwent a bowel prep for your procedure, you may not have a normal bowel movement for a few days.  Please Note:  You might notice some irritation and congestion in your nose or some drainage.  This is from the oxygen used during your procedure.  There is no need for concern and it should clear up in a day or so.  SYMPTOMS TO REPORT IMMEDIATELY:  Following lower endoscopy (colonoscopy or flexible sigmoidoscopy):  Excessive amounts of blood in the stool  Significant tenderness or worsening of abdominal pains  Swelling of the abdomen that is new, acute  Fever of 100F or higher  Following upper endoscopy (EGD)  Vomiting of blood or coffee ground material  New chest pain or pain under the shoulder blades  Painful or persistently difficult swallowing  New shortness of breath  Fever of 100F or higher  Black, tarry-looking stools  For urgent or emergent issues, a gastroenterologist can be reached at any hour by calling [phone]. Do not use MyChart messaging for urgent concerns.    DIET:  We do recommend a small meal at first, but  then you may proceed to your regular diet.  Drink plenty of fluids but you should avoid alcoholic beverages for 24 hours.  ACTIVITY:  You should plan to take it easy for the rest of today and you should NOT DRIVE or use heavy machinery until tomorrow (because of the sedation medicines used during the test).    FOLLOW UP: Our staff will call the number listed on your records the next business day following your procedure.  We will call around 7:15- 8:00 am to check on you and address any questions or concerns that you may have regarding the information given to you following your procedure. If we do not reach you, we will leave a message.  If you develop any symptoms (ie: fever, flu-like symptoms, shortness of breath, cough etc.) before then, please call [phone].  If you test positive for Covid 19 in the 2 weeks post procedure, please call and report this information to us.    If any biopsies were taken you will be contacted by phone or by letter within the next 1-3 weeks.  Please call us at [phone] if you have not heard about the biopsies in 3 weeks.    SIGNATURES/CONFIDENTIALITY: You and/or your care partner have signed paperwork which will be entered into your electronic medical record.  These signatures attest to the fact that that the information above on your After Visit Summary has been reviewed and is understood.  Full responsibility of the confidentiality   of this discharge information lies with you and/or your care-partner.  

## 2021-11-13 NOTE — Progress Notes (Signed)
PT taken to PACU. Monitors in place. VSS. Report given to RN. 

## 2021-11-13 NOTE — Progress Notes (Signed)
Pt's states no medical or surgical changes since previsit or office visit. 

## 2021-11-16 ENCOUNTER — Telehealth: Payer: Self-pay

## 2021-11-16 NOTE — Telephone Encounter (Signed)
Follow up call placed, VM obtained and message left. ?SChaplin, RN,BSN ? ?

## 2022-04-02 ENCOUNTER — Ambulatory Visit: Payer: 59 | Admitting: Endocrinology

## 2022-04-21 NOTE — Progress Notes (Signed)
57 y.o. G15P1011 Married Black or [place] American [name] here for annual exam. No vaginal bleeding.  Pt would like to address STI that had last year (trich). All her other STD testing was negative. She had negative f/u testing for trich after treatment. She has not been sexually active.     Patient's last menstrual period was 07/18/2017 (approximate).          Sexually active: No.  The current method of family planning is post menopausal status.    Exercising: No.   Plans to sign up for gym today.  Smoker: no, has not had one since 04/16/2022  Health Maintenance: Pap:  04/10/21 WNL Hr HPV Neg, 01-14-16 normal History of abnormal Pap:  yes many yrs ago MMG:  07/17/21 density C Bi-rads 1 neg  BMD:  n/a Colonoscopy: 11/13/21 f/u 5 years TDaP:  12/24/15 Gardasil: n/a   reports that she quit smoking 13 days ago. Her smoking use included cigarettes. She has never used smokeless tobacco. She reports that she does not currently use alcohol. She reports that she does not use drugs. She works as a Probation officer. [place] daughter and 3 grand sons. She has taken on one of her grandsons 2 siblings as additional grandsons. Boys range in age from 22 to 38.   Past Medical History:  Diagnosis Date   Allergy    seasonal   Hypertension    Multinodular goiter    Prediabetes 02/14/2019   Psoriasis     Past Surgical History:  Procedure Laterality Date   BREAST BIOPSY Left 05/24/2019   FIBROCYSTIC CHANGES INCLUDING APOCRINE   COLONOSCOPY  05/07/2016   DILATATION & CURETTAGE/HYSTEROSCOPY WITH MYOSURE N/A 06/09/2021   Procedure: DILATATION & CURETTAGE/HYSTEROSCOPY WITH MYOSURE;  Surgeon: Salvadore Dom, MD;  Location: [facility];  Service: Gynecology;  Laterality: N/A;    Current Outpatient Medications  Medication Sig Dispense Refill   albuterol (VENTOLIN HFA) 108 (90 Base) MCG/ACT inhaler SMARTSIG:2 Puff(s) Via Inhaler 4 Times Daily PRN     amLODipine (NORVASC)  5 MG tablet TAKE 1 TABLET (5 MG TOTAL) BY MOUTH DAILY. PATIENT NEEDS OFFICE VISIT BEFORE REFILLS WILL BE GIVEN 90 tablet 3   brompheniramine-pseudoephedrine-DM 30-2-10 MG/5ML syrup Take 10 mLs by mouth 4 (four) times daily as needed.     Calcium Carbonate-Vit D-Min (CALTRATE 600+D PLUS PO) Take by mouth.     halobetasol (ULTRAVATE) 0.05 % cream Apply topically 2 (two) times daily.     ipratropium (ATROVENT) 0.06 % nasal spray Place 2 sprays into both nostrils 4 (four) times daily.     irbesartan-hydrochlorothiazide (AVALIDE) 300-12.5 MG tablet TAKE 1 TABLET BY MOUTH EVERY DAY 90 tablet 1   montelukast (SINGULAIR) 10 MG tablet Take 10 mg by mouth daily.     Multiple Vitamin (MULTIVITAMIN PO) Take by mouth.     rosuvastatin (CRESTOR) 5 MG tablet Take 5 mg by mouth daily.     Vitamin D, Cholecalciferol, 25 MCG (1000 UT) CAPS Take by mouth.     No current facility-administered medications for this visit.    Family History  Problem Relation Age of Onset   Lung cancer Mother    Diabetes Father    Diabetes Brother    Colon cancer Neg Hx    Colon polyps Neg Hx    Esophageal cancer Neg Hx    Rectal cancer Neg Hx    Stomach cancer Neg Hx    Thyroid disease Neg Hx  Review of Systems  All other systems reviewed and are negative.   Exam:   BP 112/82   Pulse 79   Ht 5' 3.5" (1.613 m)   Wt 130 lb (59 kg)   LMP 07/18/2017 (Approximate)   SpO2 96%   BMI 22.67 kg/m   Weight change: '@WEIGHTCHANGE'$ @ Height:   Height: 5' 3.5" (161.3 cm)  Ht Readings from Last 3 Encounters:  04/29/22 5' 3.5" (1.613 m)  11/13/21 '5\' 2"'$  (1.575 m)  10/16/21 '5\' 2"'$  (1.575 m)    General appearance: alert, cooperative and appears stated age Head: Normocephalic, without obvious abnormality, atraumatic Neck: no adenopathy, supple, symmetrical, trachea midline and thyroid normal to inspection and palpation Lungs: clear to auscultation bilaterally Cardiovascular: regular rate and rhythm Breasts: normal appearance,  no masses or tenderness Abdomen: soft, non-tender; non distended,  no masses,  no organomegaly Extremities: extremities normal, atraumatic, no cyanosis or edema Skin: Skin color, texture, turgor normal. No rashes or lesions Lymph nodes: Cervical, supraclavicular, and axillary nodes normal. No abnormal inguinal nodes palpated Neurologic: Grossly normal   Pelvic: External genitalia:  no lesions              Urethra:  normal appearing urethra with no masses, tenderness or lesions              Bartholins and Skenes: normal                 Vagina: mildly atrophic appearing vagina with an increase in creamy, yellow vaginal d/c              Cervix: no lesions               Bimanual Exam:  Uterus:   no masses or tenderness              Adnexa: no mass, fullness, tenderness               Rectovaginal: Confirms               Anus:  normal sphincter tone, no lesions  Gae Dry, CMA chaperoned for the exam.  1. Well woman exam Discussed breast self exam Discussed calcium and vit D intake Mammogram in 3/24 Colonoscopy UTD Pap UTD    2. Vaginal discharge No symptoms, h/o trich (not sexually active) - WET PREP FOR TRICH, YEAST, CLUE: negative for infection

## 2022-04-29 ENCOUNTER — Encounter: Payer: Self-pay | Admitting: Obstetrics and Gynecology

## 2022-04-29 ENCOUNTER — Ambulatory Visit (INDEPENDENT_AMBULATORY_CARE_PROVIDER_SITE_OTHER): Payer: 59 | Admitting: Obstetrics and Gynecology

## 2022-04-29 VITALS — BP 112/82 | HR 79 | Ht 63.5 in | Wt 130.0 lb

## 2022-04-29 DIAGNOSIS — Z01419 Encounter for gynecological examination (general) (routine) without abnormal findings: Secondary | ICD-10-CM | POA: Diagnosis not present

## 2022-04-29 DIAGNOSIS — N898 Other specified noninflammatory disorders of vagina: Secondary | ICD-10-CM

## 2022-04-29 LAB — WET PREP FOR TRICH, YEAST, CLUE

## 2022-04-29 NOTE — Patient Instructions (Signed)

## 2022-06-18 ENCOUNTER — Other Ambulatory Visit: Payer: Self-pay | Admitting: Obstetrics and Gynecology

## 2022-06-18 DIAGNOSIS — Z1231 Encounter for screening mammogram for malignant neoplasm of breast: Secondary | ICD-10-CM

## 2022-07-13 ENCOUNTER — Telehealth (HOSPITAL_COMMUNITY): Payer: Self-pay | Admitting: Physician Assistant

## 2022-07-30 ENCOUNTER — Ambulatory Visit
Admission: RE | Admit: 2022-07-30 | Discharge: 2022-07-30 | Disposition: A | Discharge status: Home / Self Care | Payer: 59 | Source: Ambulatory Visit | Attending: Obstetrics and Gynecology | Admitting: Obstetrics and Gynecology

## 2022-07-30 DIAGNOSIS — Z1231 Encounter for screening mammogram for malignant neoplasm of breast: Secondary | ICD-10-CM

## 2023-01-04 ENCOUNTER — Other Ambulatory Visit: Payer: Self-pay | Admitting: Internal Medicine

## 2023-01-04 DIAGNOSIS — E042 Nontoxic multinodular goiter: Secondary | ICD-10-CM

## 2023-01-27 ENCOUNTER — Ambulatory Visit
Admission: RE | Admit: 2023-01-27 | Discharge: 2023-01-27 | Disposition: A | Discharge status: Home / Self Care | Payer: 59 | Source: Ambulatory Visit | Attending: Internal Medicine | Admitting: Internal Medicine

## 2023-01-27 ENCOUNTER — Other Ambulatory Visit (HOSPITAL_COMMUNITY)
Admission: RE | Admit: 2023-01-27 | Discharge: 2023-01-27 | Disposition: A | Discharge status: Home / Self Care | Payer: 59 | Source: Ambulatory Visit | Attending: Internal Medicine | Admitting: Internal Medicine

## 2023-01-27 DIAGNOSIS — E041 Nontoxic single thyroid nodule: Secondary | ICD-10-CM

## 2023-01-27 DIAGNOSIS — E042 Nontoxic multinodular goiter: Secondary | ICD-10-CM

## 2023-01-28 ENCOUNTER — Emergency Department (HOSPITAL_BASED_OUTPATIENT_CLINIC_OR_DEPARTMENT_OTHER): Payer: 59

## 2023-01-28 ENCOUNTER — Other Ambulatory Visit: Payer: Self-pay

## 2023-01-28 ENCOUNTER — Emergency Department (HOSPITAL_BASED_OUTPATIENT_CLINIC_OR_DEPARTMENT_OTHER)
Admission: EM | Admit: 2023-01-28 | Discharge: 2023-01-28 | Disposition: A | Discharge status: Home / Self Care | Payer: 59 | Attending: Emergency Medicine | Admitting: Emergency Medicine

## 2023-01-28 DIAGNOSIS — I10 Essential (primary) hypertension: Secondary | ICD-10-CM | POA: Diagnosis not present

## 2023-01-28 DIAGNOSIS — M542 Cervicalgia: Secondary | ICD-10-CM | POA: Diagnosis present

## 2023-01-28 DIAGNOSIS — Z9889 Other specified postprocedural states: Secondary | ICD-10-CM | POA: Insufficient documentation

## 2023-01-28 DIAGNOSIS — Z79899 Other long term (current) drug therapy: Secondary | ICD-10-CM | POA: Insufficient documentation

## 2023-01-28 DIAGNOSIS — Z20822 Contact with and (suspected) exposure to covid-19: Secondary | ICD-10-CM | POA: Insufficient documentation

## 2023-01-28 LAB — CBC WITH DIFFERENTIAL/PLATELET
Abs Immature Granulocytes: 0.02 10*3/uL (ref 0.00–0.07)
Basophils Absolute: 0 10*3/uL (ref 0.0–0.1)
Basophils Relative: 0 %
Eosinophils Absolute: 0 10*3/uL (ref 0.0–0.5)
Eosinophils Relative: 0 %
HCT: 37.5 % (ref 36.0–46.0)
Hemoglobin: 12.8 g/dL (ref 12.0–15.0)
Immature Granulocytes: 0 %
Lymphocytes Relative: 13 %
Lymphs Abs: 0.7 10*3/uL (ref 0.7–4.0)
MCH: 28.5 pg (ref 26.0–34.0)
MCHC: 34.1 g/dL (ref 30.0–36.0)
MCV: 83.5 fL (ref 80.0–100.0)
Monocytes Absolute: 0.5 10*3/uL (ref 0.1–1.0)
Monocytes Relative: 9 %
Neutro Abs: 4.2 10*3/uL (ref 1.7–7.7)
Neutrophils Relative %: 78 %
Platelets: 202 10*3/uL (ref 150–400)
RBC: 4.49 MIL/uL (ref 3.87–5.11)
RDW: 13.2 % (ref 11.5–15.5)
WBC: 5.6 10*3/uL (ref 4.0–10.5)
nRBC: 0 % (ref 0.0–0.2)

## 2023-01-28 LAB — RESP PANEL BY RT-PCR (RSV, FLU A&B, COVID)  RVPGX2
Influenza A by PCR: NEGATIVE
Influenza B by PCR: NEGATIVE
Resp Syncytial Virus by PCR: NEGATIVE
SARS Coronavirus 2 by RT PCR: NEGATIVE

## 2023-01-28 LAB — BASIC METABOLIC PANEL
Anion gap: 9 (ref 5–15)
BUN: 18 mg/dL (ref 6–20)
CO2: 24 mmol/L (ref 22–32)
Calcium: 9.2 mg/dL (ref 8.9–10.3)
Chloride: 101 mmol/L (ref 98–111)
Creatinine, Ser: 0.6 mg/dL (ref 0.44–1.00)
GFR, Estimated: >60 mL/min (ref >60)
Glucose, Bld: 108 mg/dL — ABNORMAL HIGH (ref 70–99)
Potassium: 4.1 mmol/L (ref 3.5–5.1)
Sodium: 134 mmol/L — ABNORMAL LOW (ref 135–145)

## 2023-01-28 LAB — CYTOLOGY - NON PAP

## 2023-01-28 MED ORDER — IOHEXOL 300 MG/ML  SOLN
100.0000 mL | Freq: Once | INTRAMUSCULAR | Status: AC | PRN
  Administered 2023-01-28: 75 mL via INTRAVENOUS

## 2023-01-28 MED ORDER — SODIUM CHLORIDE 0.9 % IV BOLUS
1000.0000 mL | Freq: Once | INTRAVENOUS | Status: AC
  Administered 2023-01-28: 1000 mL via INTRAVENOUS

## 2023-01-28 NOTE — ED Triage Notes (Signed)
  Patient comes in with neck pain and SOB that started earlier yesterday.  Patient states she had biopsy performed around 1400 yesterday afternoon and that provider had a hard time getting it done.  Patient states she had to be stuck multiple times and had a large hematoma on the R side of her neck before leaving.  Patient states her neck has been in pain since procedure but woke up around 0000 with what feels like swelling around her neck.  Patient states its making her feel short of breath.  Pain 7/10, tender/sore.

## 2023-01-28 NOTE — ED Notes (Signed)
Reviewed AVS with patient, patient expressed understanding of directions, denies further questions at this time. 

## 2023-01-28 NOTE — Discharge Instructions (Signed)
Apply ice for 20 minutes every 2 hours while awake for the next 2 days.  Return to the emergency department if your symptoms significantly worsen or change.  You do have a narrowing of one of your carotid arteries.  Please follow this up with your primary doctor at your next visit.

## 2023-01-28 NOTE — ED Provider Notes (Signed)
[place] EMERGENCY DEPARTMENT AT [facility] Provider Note   CSN: [id number] Arrival date & time: 01/28/23  0149     History  Chief Complaint  Patient presents with   Neck Pain    ELLAKATE GONSALVES is a 57 y.o. female.  Patient is a 57 year old female with past medical history of hypertension, prediabetes.  Patient presenting with complaints of neck pain and swelling.  She reports undergoing a thyroid biopsy earlier today.  Since returning home, she has now noticed swelling in her neck and feels as though it is difficult to breathe or swallow.  She denies fevers or chills.  She does report some cough.  The history is provided by the patient.       Home Medications Prior to Admission medications   Medication Sig Start Date End Date Taking? Authorizing Provider  albuterol (VENTOLIN HFA) 108 (90 Base) MCG/ACT inhaler SMARTSIG:2 Puff(s) Via Inhaler 4 Times Daily PRN 03/28/22   [provider]  amLODipine (NORVASC) 5 MG tablet TAKE 1 TABLET (5 MG TOTAL) BY MOUTH DAILY. PATIENT NEEDS OFFICE VISIT BEFORE REFILLS WILL BE GIVEN 07/04/18   Plotnikov, Georgina Quint, MD  brompheniramine-pseudoephedrine-DM 30-2-10 MG/5ML syrup Take 10 mLs by mouth 4 (four) times daily as needed. 03/28/22   [provider]  Calcium Carbonate-Vit D-Min (CALTRATE 600+D PLUS PO) Take by mouth.    [provider]  halobetasol (ULTRAVATE) 0.05 % cream Apply topically 2 (two) times daily.    [provider]  ipratropium (ATROVENT) 0.06 % nasal spray Place 2 sprays into both nostrils 4 (four) times daily. 03/28/22   [provider]  irbesartan-hydrochlorothiazide (AVALIDE) 300-12.5 MG tablet TAKE 1 TABLET BY MOUTH EVERY DAY 08/29/19   Plotnikov, Georgina Quint, MD  montelukast (SINGULAIR) 10 MG tablet Take 10 mg by mouth daily. 03/28/22   [provider]  Multiple Vitamin (MULTIVITAMIN PO) Take by mouth.    [provider]  rosuvastatin (CRESTOR) 5 MG tablet  Take 5 mg by mouth daily.    [provider]  Vitamin D, Cholecalciferol, 25 MCG (1000 UT) CAPS Take by mouth.    [provider]      Allergies    Patient has no known allergies.    Review of Systems   Review of Systems  All other systems reviewed and are negative.   Physical Exam Updated Vital Signs BP (!) 154/96 (BP Location: Left Arm)   Pulse (!) 127   Temp 99.1 F (37.3 C) (Oral)   Resp (!) 22   Ht 5' 3.5" (1.613 m)   Wt 59 kg   LMP 07/18/2017 (Approximate)   SpO2 94%   BMI 22.67 kg/m  Physical Exam Vitals and nursing note reviewed.  Constitutional:      General: She is not in acute distress.    Appearance: She is well-developed. She is not diaphoretic.  HENT:     Head: Normocephalic and atraumatic.  Neck:     Comments: Examination of the neck is basically unremarkable.  I palpate no significant swelling or masses.  The biopsy site appears well with no erythema or drainage. Cardiovascular:     Rate and Rhythm: Normal rate and regular rhythm.     Heart sounds: No murmur heard.    No friction rub. No gallop.  Pulmonary:     Effort: Pulmonary effort is normal. No respiratory distress.     Breath sounds: Normal breath sounds. No wheezing.  Abdominal:     General: Bowel sounds are  normal. There is no distension.     Palpations: Abdomen is soft.     Tenderness: There is no abdominal tenderness.  Musculoskeletal:        General: Normal range of motion.     Cervical back: Normal range of motion and neck supple.  Skin:    General: Skin is warm and dry.  Neurological:     General: No focal deficit present.     Mental Status: She is alert and oriented to person, place, and time.     ED Results / Procedures / Treatments   Labs (all labs ordered are listed, but only abnormal results are displayed) Labs Reviewed - No data to display  EKG None  Radiology [place] FNA BX THYROID 1ST LESION AFIRMA  Result Date: 01/27/2023 INDICATION: Indeterminate  thyroid nodules EXAM: ULTRASOUND GUIDED FINE NEEDLE ASPIRATION OF INDETERMINATE THYROID NODULE COMPARISON:  None Available. MEDICATIONS: None COMPLICATIONS: None immediate. TECHNIQUE: Informed written consent was obtained from the patient after a discussion of the risks, benefits and alternatives to treatment. Questions regarding the procedure were encouraged and answered. A timeout was performed prior to the initiation of the procedure. Pre-procedural ultrasound scanning demonstrated unchanged size and appearance of the indeterminate nodules within the right mid gland and right superior gland The procedure was planned. The neck was prepped in the usual sterile fashion, and a sterile drape was applied covering the operative field. A timeout was performed prior to the initiation of the procedure. Local anesthesia was provided with 1% lidocaine. Under direct ultrasound guidance, 5 FNA biopsies were performed of the 2.6 cm TI-RADS category 4 nodule in the right superior gland with a 25 gauge needle. Two samples were reserved for future Afirma testing. Multiple ultrasound images were saved for procedural documentation purposes. The samples were prepared and submitted to pathology. Under direct ultrasound guidance, 5 FNA biopsies were performed of the 5.4 cm TI-RADS category 3 nodule in the right mid gland with a 25 gauge needle. Two samples were reserved for future Afirma testing. Multiple ultrasound images were saved for procedural documentation purposes. The samples were prepared and submitted to pathology. Limited post procedural scanning was negative for hematoma or additional complication. Dressings were placed. The patient tolerated the above procedures procedure well without immediate postprocedural complication. FINDINGS: Nodule reference number based on prior diagnostic ultrasound: 2 Maximum size: 2.6 cm Location: Right; Superior ACR TI-RADS risk category: TR4 (4-6 points) Reason for biopsy: meets ACR TI-RADS  criteria _________________________________________________________ Nodule reference number based on prior diagnostic ultrasound: 1 Maximum size: 5.4 cm Location: Right; Mid ACR TI-RADS risk category: TR3 (3 points) Reason for biopsy: meets ACR TI-RADS criteria Ultrasound imaging confirms appropriate placement of the needles within the thyroid nodule. IMPRESSION: 1. Technically successful ultrasound guided fine needle aspiration of nodule # 2 (2.6 cm TI-RADS category 4, right superior gland). 2. Technically successful ultrasound guided fine needle aspiration of nodule # 1 (5.4 cm, TI-RADS category 3, right mid gland). Electronically Signed   By: Malachy Moan M.D.   On: 01/27/2023 15:21   [place] FNA BX THYROID 1ST LESION AFIRMA  Result Date: 01/27/2023 INDICATION: Indeterminate thyroid nodules EXAM: ULTRASOUND GUIDED FINE NEEDLE ASPIRATION OF INDETERMINATE THYROID NODULE COMPARISON:  None Available. MEDICATIONS: None COMPLICATIONS: None immediate. TECHNIQUE: Informed written consent was obtained from the patient after a discussion of the risks, benefits and alternatives to treatment. Questions regarding the procedure were encouraged and answered. A timeout was performed prior to the initiation of the procedure. Pre-procedural ultrasound scanning demonstrated unchanged  size and appearance of the indeterminate nodules within the right mid gland and right superior gland The procedure was planned. The neck was prepped in the usual sterile fashion, and a sterile drape was applied covering the operative field. A timeout was performed prior to the initiation of the procedure. Local anesthesia was provided with 1% lidocaine. Under direct ultrasound guidance, 5 FNA biopsies were performed of the 2.6 cm TI-RADS category 4 nodule in the right superior gland with a 25 gauge needle. Two samples were reserved for future Afirma testing. Multiple ultrasound images were saved for procedural documentation purposes. The samples  were prepared and submitted to pathology. Under direct ultrasound guidance, 5 FNA biopsies were performed of the 5.4 cm TI-RADS category 3 nodule in the right mid gland with a 25 gauge needle. Two samples were reserved for future Afirma testing. Multiple ultrasound images were saved for procedural documentation purposes. The samples were prepared and submitted to pathology. Limited post procedural scanning was negative for hematoma or additional complication. Dressings were placed. The patient tolerated the above procedures procedure well without immediate postprocedural complication. FINDINGS: Nodule reference number based on prior diagnostic ultrasound: 2 Maximum size: 2.6 cm Location: Right; Superior ACR TI-RADS risk category: TR4 (4-6 points) Reason for biopsy: meets ACR TI-RADS criteria _________________________________________________________ Nodule reference number based on prior diagnostic ultrasound: 1 Maximum size: 5.4 cm Location: Right; Mid ACR TI-RADS risk category: TR3 (3 points) Reason for biopsy: meets ACR TI-RADS criteria Ultrasound imaging confirms appropriate placement of the needles within the thyroid nodule. IMPRESSION: 1. Technically successful ultrasound guided fine needle aspiration of nodule # 2 (2.6 cm TI-RADS category 4, right superior gland). 2. Technically successful ultrasound guided fine needle aspiration of nodule # 1 (5.4 cm, TI-RADS category 3, right mid gland). Electronically Signed   By: Malachy Moan M.D.   On: 01/27/2023 15:21    Procedures Procedures    Medications Ordered in ED Medications  sodium chloride 0.9 % bolus 1,000 mL (has no administration in time range)    ED Course/ Medical Decision Making/ A&P  Patient is a 57 year old female presenting with complaints of neck discomfort and difficulty breathing and swallowing.  She had a thyroid biopsy earlier today.  Patient arrives here with no respiratory distress and is nonstridorous.  Her physical  examination reveals no significant abnormalities.  Workup initiated including CBC and basic metabolic panel, both of which are unremarkable.  Due to the airway complaints, I elected to obtain a CT scan of the neck with soft tissue protocol.  This showed post biopsy inflammatory changes, but no bleeding or fluid collections.  Airway is patent.  There was a mention of a carotid stenosis and this was relayed to the patient.  I have advised her to follow-up with her primary doctor regarding this, but nothing emergent needs to be performed this evening.  Patient to be discharged with as needed return.  Final Clinical Impression(s) / ED Diagnoses Final diagnoses:  None    Rx / DC Orders ED Discharge Orders     None         Geoffery Lyons, MD 01/28/23 [phone]

## 2023-06-09 ENCOUNTER — Other Ambulatory Visit: Payer: Self-pay | Admitting: Obstetrics and Gynecology

## 2023-06-09 DIAGNOSIS — Z1231 Encounter for screening mammogram for malignant neoplasm of breast: Secondary | ICD-10-CM

## 2023-07-08 ENCOUNTER — Ambulatory Visit: Payer: 59 | Admitting: Obstetrics and Gynecology

## 2023-07-14 ENCOUNTER — Encounter: Payer: Self-pay | Admitting: Obstetrics and Gynecology

## 2023-07-14 ENCOUNTER — Ambulatory Visit (INDEPENDENT_AMBULATORY_CARE_PROVIDER_SITE_OTHER): Payer: 59 | Admitting: Obstetrics and Gynecology

## 2023-07-14 VITALS — BP 124/76 | HR 84 | Temp 98.0°F | Ht 63.0 in | Wt 129.0 lb

## 2023-07-14 DIAGNOSIS — Z1331 Encounter for screening for depression: Secondary | ICD-10-CM | POA: Diagnosis not present

## 2023-07-14 DIAGNOSIS — Z01419 Encounter for gynecological examination (general) (routine) without abnormal findings: Secondary | ICD-10-CM | POA: Insufficient documentation

## 2023-07-14 NOTE — Patient Instructions (Signed)

## 2023-07-14 NOTE — Progress Notes (Signed)
 58 y.o. G57P1011 female here for annual exam. Married. Hair stylist.  Patient's last menstrual period was 07/18/2017 (approximate). No complaints, doing well. Abnormal bleeding: none Pelvic discharge or pain: none Breast mass, nipple discharge or skin changes : none  Last PAP:     Component Value Date/Time   DIAGPAP  04/10/2021 1224    - Negative for intraepithelial lesion or malignancy (NILM)   HPVHIGH Negative 04/10/2021 1224   ADEQPAP  04/10/2021 1224    Satisfactory for evaluation; transformation zone component PRESENT.   Last mammogram: 07/19/22 BIRADS 1, density c Last colonoscopy: 11/13/21, q39yr Sexually active: no  Exercising: no Smoker: yes  GYN HISTORY: Myosure polypectomy 2023  OB History  Gravida Para Term Preterm AB Living  2 1 1  1 1   SAB IAB Ectopic Multiple Live Births  1    1    # Outcome Date GA Lbr Len/2nd Weight Sex Type Anes PTL Lv  2 SAB           1 Term      Vag-Spont   LIV    Past Medical History:  Diagnosis Date   Allergy    seasonal   Hypertension    Multinodular goiter    Prediabetes 02/14/2019   Psoriasis     Past Surgical History:  Procedure Laterality Date   BREAST BIOPSY Left 05/24/2019   FIBROCYSTIC CHANGES INCLUDING APOCRINE   COLONOSCOPY  05/07/2016   DILATATION & CURETTAGE/HYSTEROSCOPY WITH MYOSURE N/A 06/09/2021   Procedure: DILATATION & CURETTAGE/HYSTEROSCOPY WITH MYOSURE;  Surgeon: Romualdo Bolk, MD;  Location: [facility];  Service: Gynecology;  Laterality: N/A;    Current Outpatient Medications on File Prior to Visit  Medication Sig Dispense Refill   amLODipine (NORVASC) 5 MG tablet TAKE 1 TABLET (5 MG TOTAL) BY MOUTH DAILY. PATIENT NEEDS OFFICE VISIT BEFORE REFILLS WILL BE GIVEN 90 tablet 3   halobetasol (ULTRAVATE) 0.05 % cream Apply topically 2 (two) times daily.     irbesartan-hydrochlorothiazide (AVALIDE) 300-12.5 MG tablet TAKE 1 TABLET BY MOUTH EVERY DAY 90 tablet 1   metFORMIN  (GLUCOPHAGE-XR) 500 MG 24 hr tablet 1 tablet with evening meal Orally Once a day for 90 days     Calcium Carbonate-Vit D-Min (CALTRATE 600+D PLUS PO) Take by mouth. (Patient not taking: Reported on 07/14/2023)     No current facility-administered medications on file prior to visit.    Social History   Socioeconomic History   Marital status: Married    Spouse name: Not on file   Number of children: Not on file   Years of education: Not on file   Highest education level: Not on file  Occupational History   Not on file  Tobacco Use   Smoking status: Some Days    Current packs/day: 0.00    Types: Cigarettes    Last attempt to quit: 04/16/2022    Years since quitting: 1.2   Smokeless tobacco: Never   Tobacco comments:    Start smoking as teenager  Vaping Use   Vaping status: Never Used  Substance and Sexual Activity   Alcohol use: Yes    Comment: occassionally   Drug use: Never   Sexual activity: Not Currently    Partners: Male    Birth control/protection: Post-menopausal  Other Topics Concern   Not on file  Social History Narrative   Not on file   Social Drivers of Health   Financial Resource Strain: Not on file  Food Insecurity: Not on  file  Transportation Needs: Not on file  Physical Activity: Not on file  Stress: Not on file  Social Connections: Not on file  Intimate Partner Violence: Not on file    Family History  Problem Relation Age of Onset   Lung cancer Mother    Diabetes Father    Diabetes Brother    Colon cancer Neg Hx    Colon polyps Neg Hx    Esophageal cancer Neg Hx    Rectal cancer Neg Hx    Stomach cancer Neg Hx    Thyroid disease Neg Hx     No Known Allergies    PE Today's Vitals   07/14/23 1137  BP: 124/76  Pulse: 84  Temp: 98 F (36.7 C)  TempSrc: Oral  SpO2: 97%  Weight: 129 lb (58.5 kg)  Height: 5\' 3"  (1.6 m)   Body mass index is 22.85 kg/m.  Physical Exam Vitals reviewed. Exam conducted with a chaperone present.   Constitutional:      General: She is not in acute distress.    Appearance: Normal appearance.  HENT:     Head: Normocephalic and atraumatic.     Nose: Nose normal.  Eyes:     Extraocular Movements: Extraocular movements intact.     Conjunctiva/sclera: Conjunctivae normal.  Neck:     Thyroid: No thyroid mass, thyromegaly or thyroid tenderness.  Pulmonary:     Effort: Pulmonary effort is normal.  Chest:     Chest wall: No mass or tenderness.  Breasts:    Right: Normal. No swelling, mass, nipple discharge, skin change or tenderness.     Left: Normal. No swelling, mass, nipple discharge, skin change or tenderness.  Abdominal:     General: There is no distension.     Palpations: Abdomen is soft.     Tenderness: There is no abdominal tenderness.  Genitourinary:    General: Normal vulva.     Exam position: Lithotomy position.     Urethra: No prolapse.     Vagina: Normal. No vaginal discharge or bleeding.     Cervix: Normal. No lesion.     Uterus: Normal. Not enlarged and not tender.      Adnexa: Right adnexa normal and left adnexa normal.  Musculoskeletal:        General: Normal range of motion.     Cervical back: Normal range of motion.  Lymphadenopathy:     Upper Body:     Right upper body: No axillary adenopathy.     Left upper body: No axillary adenopathy.     Lower Body: No right inguinal adenopathy. No left inguinal adenopathy.  Skin:    General: Skin is warm and dry.  Neurological:     General: No focal deficit present.     Mental Status: She is alert.  Psychiatric:        Mood and Affect: Mood normal.        Behavior: Behavior normal.     Assessment and Plan:        Well woman exam with routine gynecological exam Assessment & Plan: Cervical cancer screening performed according to ASCCP guidelines. Encouraged annual mammogram screening Colonoscopy UTD DXA N/A Labs and immunizations with her primary Encouraged safe sexual practices as indicated Encouraged  healthy lifestyle practices with diet and exercise For patients under 50-70yo, I recommend 1200mg  calcium daily and 600IU of vitamin D daily.    Rosalyn Gess, MD

## 2023-07-14 NOTE — Assessment & Plan Note (Signed)
 Cervical cancer screening performed according to ASCCP guidelines. Encouraged annual mammogram screening Colonoscopy UTD DXA N/A Labs and immunizations with her primary Encouraged safe sexual practices as indicated Encouraged healthy lifestyle practices with diet and exercise For patients under 50-59yo, I recommend 1200mg  calcium daily and 600IU of vitamin D daily.

## 2023-07-18 ENCOUNTER — Ambulatory Visit: Payer: 59 | Admitting: Obstetrics and Gynecology

## 2023-08-01 ENCOUNTER — Ambulatory Visit
Admission: RE | Admit: 2023-08-01 | Discharge: 2023-08-01 | Disposition: A | Discharge status: Home / Self Care | Payer: 59 | Source: Ambulatory Visit | Attending: Obstetrics and Gynecology | Admitting: Obstetrics and Gynecology

## 2023-08-01 DIAGNOSIS — Z1231 Encounter for screening mammogram for malignant neoplasm of breast: Secondary | ICD-10-CM
